# Patient Record
Sex: Male | Born: 1955 | Race: White | Hispanic: No | Marital: Married | Smoking: Former smoker
Health system: Southern US, Community
[De-identification: ages and names within clinical notes are randomized; demographics above are authoritative.]

## PROBLEM LIST (undated history)

## (undated) DIAGNOSIS — I10 Essential (primary) hypertension: Secondary | ICD-10-CM

## (undated) DIAGNOSIS — I2510 Atherosclerotic heart disease of native coronary artery without angina pectoris: Secondary | ICD-10-CM

## (undated) DIAGNOSIS — F329 Major depressive disorder, single episode, unspecified: Secondary | ICD-10-CM

## (undated) DIAGNOSIS — K635 Polyp of colon: Secondary | ICD-10-CM

## (undated) DIAGNOSIS — F32A Depression, unspecified: Secondary | ICD-10-CM

## (undated) DIAGNOSIS — N200 Calculus of kidney: Secondary | ICD-10-CM

## (undated) DIAGNOSIS — R42 Dizziness and giddiness: Secondary | ICD-10-CM

## (undated) DIAGNOSIS — R7303 Prediabetes: Secondary | ICD-10-CM

## (undated) DIAGNOSIS — M1990 Unspecified osteoarthritis, unspecified site: Secondary | ICD-10-CM

## (undated) DIAGNOSIS — N189 Chronic kidney disease, unspecified: Secondary | ICD-10-CM

## (undated) DIAGNOSIS — G4730 Sleep apnea, unspecified: Secondary | ICD-10-CM

## (undated) DIAGNOSIS — I519 Heart disease, unspecified: Secondary | ICD-10-CM

## (undated) DIAGNOSIS — R0600 Dyspnea, unspecified: Secondary | ICD-10-CM

## (undated) DIAGNOSIS — E7800 Pure hypercholesterolemia, unspecified: Secondary | ICD-10-CM

## (undated) DIAGNOSIS — Z87442 Personal history of urinary calculi: Secondary | ICD-10-CM

## (undated) HISTORY — DX: Polyp of colon: K63.5

## (undated) HISTORY — PX: CARDIAC CATHETERIZATION: SHX172

## (undated) HISTORY — DX: Heart disease, unspecified: I51.9

## (undated) HISTORY — PX: OTHER SURGICAL HISTORY: SHX169

## (undated) HISTORY — DX: Calculus of kidney: N20.0

## (undated) HISTORY — DX: Pure hypercholesterolemia, unspecified: E78.00

---

## 2010-11-04 ENCOUNTER — Ambulatory Visit (HOSPITAL_COMMUNITY)
Admission: RE | Admit: 2010-11-04 | Discharge: 2010-11-04 | Disposition: A | Discharge status: Home / Self Care | Payer: Medicaid Other | Source: Ambulatory Visit | Attending: Physical Therapy | Admitting: Physical Therapy

## 2010-11-04 DIAGNOSIS — M6281 Muscle weakness (generalized): Secondary | ICD-10-CM | POA: Insufficient documentation

## 2010-11-04 DIAGNOSIS — IMO0001 Reserved for inherently not codable concepts without codable children: Secondary | ICD-10-CM | POA: Insufficient documentation

## 2010-11-04 DIAGNOSIS — M545 Low back pain, unspecified: Secondary | ICD-10-CM | POA: Insufficient documentation

## 2010-11-04 DIAGNOSIS — G8929 Other chronic pain: Secondary | ICD-10-CM | POA: Insufficient documentation

## 2010-11-04 DIAGNOSIS — M79609 Pain in unspecified limb: Secondary | ICD-10-CM | POA: Insufficient documentation
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
## 2010-11-11 ENCOUNTER — Ambulatory Visit (HOSPITAL_COMMUNITY): Payer: Medicaid Other | Admitting: Physical Therapy

## 2010-11-11 ENCOUNTER — Telehealth (HOSPITAL_COMMUNITY): Payer: Self-pay

## 2010-11-18 ENCOUNTER — Ambulatory Visit (HOSPITAL_COMMUNITY): Payer: Medicaid Other | Admitting: Physical Therapy

## 2013-10-04 HISTORY — PX: CORONARY STENT PLACEMENT: SHX1402

## 2013-11-20 ENCOUNTER — Encounter (HOSPITAL_COMMUNITY)
Admission: RE | Admit: 2013-11-20 | Discharge: 2013-11-20 | Disposition: A | Discharge status: Home / Self Care | Payer: Medicare HMO | Source: Ambulatory Visit | Attending: Cardiology | Admitting: Cardiology

## 2013-11-20 ENCOUNTER — Encounter (HOSPITAL_COMMUNITY): Payer: Self-pay

## 2013-11-20 VITALS — BP 130/68 | HR 61 | Ht 72.0 in | Wt 239.2 lb

## 2013-11-20 DIAGNOSIS — Z9861 Coronary angioplasty status: Secondary | ICD-10-CM

## 2013-11-20 DIAGNOSIS — Z955 Presence of coronary angioplasty implant and graft: Secondary | ICD-10-CM | POA: Insufficient documentation

## 2013-11-20 DIAGNOSIS — I2510 Atherosclerotic heart disease of native coronary artery without angina pectoris: Secondary | ICD-10-CM | POA: Insufficient documentation

## 2013-11-20 DIAGNOSIS — Z5189 Encounter for other specified aftercare: Secondary | ICD-10-CM | POA: Insufficient documentation

## 2013-11-20 HISTORY — DX: Atherosclerotic heart disease of native coronary artery without angina pectoris: I25.10

## 2013-11-20 HISTORY — DX: Chronic kidney disease, unspecified: N18.9

## 2013-11-20 NOTE — Progress Notes (Signed)
Patient was referred to CR by Dr. Gennette Pac due to Stent placement V45.82. During orientation advised patient on arrival and appointment times what to wear, what to do before, during and after exercise. Reviewed attendance and class policy. Talked about inclement weather and class consultation policy. Pt is scheduled to start Cardiac Rehab on 11/26/13 at 9:30am. Pt was advised to come to class 5 minutes before class starts. He was also given instructions on meeting with the dietician and attending the Family Structure classes. Pt is eager to get started. Patient was able to complete 6 minute walk test in 6 minutes.

## 2013-11-20 NOTE — Patient Instructions (Signed)
Pt has finished orientation and is scheduled to start CR on 11/26/13 at 9:30 am. Pt has been instructed to arrive to class 15 minutes early for scheduled class. Pt has been instructed to wear comfortable clothing and shoes with rubber soles. Pt has been told to take their medications 1 hour prior to coming to class.  If the patient is not going to attend class, he/she has been instructed to call.

## 2013-11-26 ENCOUNTER — Encounter (HOSPITAL_COMMUNITY)
Admission: RE | Admit: 2013-11-26 | Discharge: 2013-11-26 | Disposition: A | Discharge status: Home / Self Care | Payer: Medicare HMO | Source: Ambulatory Visit | Attending: Cardiology | Admitting: Cardiology

## 2013-11-26 DIAGNOSIS — Z955 Presence of coronary angioplasty implant and graft: Secondary | ICD-10-CM | POA: Diagnosis not present

## 2013-11-26 DIAGNOSIS — Z5189 Encounter for other specified aftercare: Secondary | ICD-10-CM | POA: Diagnosis present

## 2013-11-26 DIAGNOSIS — I2510 Atherosclerotic heart disease of native coronary artery without angina pectoris: Secondary | ICD-10-CM | POA: Diagnosis not present

## 2013-11-28 ENCOUNTER — Encounter (HOSPITAL_COMMUNITY)
Admission: RE | Admit: 2013-11-28 | Discharge: 2013-11-28 | Disposition: A | Discharge status: Home / Self Care | Payer: Medicare HMO | Source: Ambulatory Visit | Attending: Cardiology | Admitting: Cardiology

## 2013-11-28 DIAGNOSIS — Z5189 Encounter for other specified aftercare: Secondary | ICD-10-CM | POA: Diagnosis not present

## 2013-11-30 ENCOUNTER — Encounter (HOSPITAL_COMMUNITY): Payer: Medicare HMO

## 2013-12-03 ENCOUNTER — Encounter (HOSPITAL_COMMUNITY)
Admission: RE | Admit: 2013-12-03 | Discharge: 2013-12-03 | Disposition: A | Discharge status: Home / Self Care | Payer: Medicare HMO | Source: Ambulatory Visit | Attending: Cardiology | Admitting: Cardiology

## 2013-12-03 DIAGNOSIS — Z5189 Encounter for other specified aftercare: Secondary | ICD-10-CM | POA: Diagnosis not present

## 2013-12-05 ENCOUNTER — Encounter (HOSPITAL_COMMUNITY)
Admission: RE | Admit: 2013-12-05 | Discharge: 2013-12-05 | Disposition: A | Discharge status: Home / Self Care | Payer: Medicare HMO | Source: Ambulatory Visit | Attending: Cardiology | Admitting: Cardiology

## 2013-12-05 DIAGNOSIS — Z5189 Encounter for other specified aftercare: Secondary | ICD-10-CM | POA: Diagnosis not present

## 2013-12-05 NOTE — Progress Notes (Signed)
Cardiac Rehabilitation Program Outcomes Report   Orientation:  11/26/13 Graduate Date:  tbd Discharge Date:  tbd # of sessions completed: 3  Cardiologist: Gennette Pac Family MD:  Baucom Class Time:  0930  A.  Exercise Program:  Tolerates exercise @ 3.71 METS for 15 minutes and Walk Test Results:  Pre: 2.45 mets  B.  Mental Health:  Good mental attitude  C.  Education/Instruction/Skills  Accurately checks own pulse.  Rest:  77  Exercise:  92 and Knows THR for exercise  Uses Perceived Exertion Scale and/or Dyspnea Scale  D.  Nutrition/Weight Control/Body Composition:  Adherence to prescribed nutrition program: good    E.  Blood Lipids    No results found for this basename: CHOL, HDL, LDLCALC, LDLDIRECT, TRIG, CHOLHDL    F.  Lifestyle Changes:  Making positive lifestyle changes  G.  Symptoms noted with exercise:  Asymptomatic  Report Completed By:  Benay Pillow RN   Comments:  First week progress note.

## 2013-12-07 ENCOUNTER — Encounter (HOSPITAL_COMMUNITY): Payer: Medicare HMO

## 2013-12-10 ENCOUNTER — Encounter (HOSPITAL_COMMUNITY)
Admission: RE | Admit: 2013-12-10 | Discharge: 2013-12-10 | Disposition: A | Discharge status: Home / Self Care | Payer: Medicare HMO | Source: Ambulatory Visit | Attending: Cardiology | Admitting: Cardiology

## 2013-12-10 DIAGNOSIS — Z5189 Encounter for other specified aftercare: Secondary | ICD-10-CM | POA: Diagnosis present

## 2013-12-10 DIAGNOSIS — I2510 Atherosclerotic heart disease of native coronary artery without angina pectoris: Secondary | ICD-10-CM | POA: Insufficient documentation

## 2013-12-10 DIAGNOSIS — Z955 Presence of coronary angioplasty implant and graft: Secondary | ICD-10-CM | POA: Diagnosis not present

## 2013-12-12 ENCOUNTER — Encounter (HOSPITAL_COMMUNITY): Payer: Medicare HMO

## 2013-12-12 DIAGNOSIS — Z5189 Encounter for other specified aftercare: Secondary | ICD-10-CM | POA: Diagnosis not present

## 2013-12-14 ENCOUNTER — Encounter (HOSPITAL_COMMUNITY): Payer: Medicare HMO

## 2013-12-17 ENCOUNTER — Encounter (HOSPITAL_COMMUNITY)
Admission: RE | Admit: 2013-12-17 | Discharge: 2013-12-17 | Disposition: A | Discharge status: Home / Self Care | Payer: Medicare HMO | Source: Ambulatory Visit | Attending: Cardiology | Admitting: Cardiology

## 2013-12-17 DIAGNOSIS — Z5189 Encounter for other specified aftercare: Secondary | ICD-10-CM | POA: Diagnosis not present

## 2013-12-18 ENCOUNTER — Encounter (HOSPITAL_COMMUNITY): Payer: Medicare HMO

## 2013-12-19 ENCOUNTER — Encounter (HOSPITAL_COMMUNITY)
Admission: RE | Admit: 2013-12-19 | Discharge: 2013-12-19 | Disposition: A | Discharge status: Home / Self Care | Payer: Medicare HMO | Source: Ambulatory Visit | Attending: Cardiology | Admitting: Cardiology

## 2013-12-19 DIAGNOSIS — Z5189 Encounter for other specified aftercare: Secondary | ICD-10-CM | POA: Diagnosis not present

## 2013-12-21 ENCOUNTER — Encounter (HOSPITAL_COMMUNITY): Payer: Medicare HMO

## 2013-12-24 ENCOUNTER — Encounter (HOSPITAL_COMMUNITY)
Admission: RE | Admit: 2013-12-24 | Discharge: 2013-12-24 | Disposition: A | Discharge status: Home / Self Care | Payer: Medicare HMO | Source: Ambulatory Visit | Attending: Cardiology | Admitting: Cardiology

## 2013-12-24 DIAGNOSIS — Z5189 Encounter for other specified aftercare: Secondary | ICD-10-CM | POA: Diagnosis not present

## 2013-12-26 ENCOUNTER — Encounter (HOSPITAL_COMMUNITY)
Admission: RE | Admit: 2013-12-26 | Discharge: 2013-12-26 | Disposition: A | Discharge status: Home / Self Care | Payer: Medicare HMO | Source: Ambulatory Visit | Attending: Cardiology | Admitting: Cardiology

## 2013-12-26 DIAGNOSIS — Z5189 Encounter for other specified aftercare: Secondary | ICD-10-CM | POA: Diagnosis not present

## 2013-12-28 ENCOUNTER — Encounter (HOSPITAL_COMMUNITY): Payer: Medicare HMO

## 2013-12-31 ENCOUNTER — Encounter (HOSPITAL_COMMUNITY)
Admission: RE | Admit: 2013-12-31 | Discharge: 2013-12-31 | Disposition: A | Discharge status: Home / Self Care | Payer: Medicare HMO | Source: Ambulatory Visit | Attending: Cardiology | Admitting: Cardiology

## 2013-12-31 DIAGNOSIS — Z5189 Encounter for other specified aftercare: Secondary | ICD-10-CM | POA: Diagnosis not present

## 2014-01-02 ENCOUNTER — Encounter (HOSPITAL_COMMUNITY): Payer: Medicare HMO

## 2014-01-04 ENCOUNTER — Encounter (HOSPITAL_COMMUNITY): Payer: Medicare HMO

## 2014-01-07 ENCOUNTER — Encounter (HOSPITAL_COMMUNITY)
Admission: RE | Admit: 2014-01-07 | Discharge: 2014-01-07 | Disposition: A | Discharge status: Home / Self Care | Payer: Medicare HMO | Source: Ambulatory Visit | Attending: Cardiology | Admitting: Cardiology

## 2014-01-07 DIAGNOSIS — Z5189 Encounter for other specified aftercare: Secondary | ICD-10-CM | POA: Diagnosis not present

## 2014-01-09 ENCOUNTER — Encounter (HOSPITAL_COMMUNITY)
Admission: RE | Admit: 2014-01-09 | Discharge: 2014-01-09 | Disposition: A | Discharge status: Home / Self Care | Payer: Medicare HMO | Source: Ambulatory Visit | Attending: Cardiology | Admitting: Cardiology

## 2014-01-09 DIAGNOSIS — Z955 Presence of coronary angioplasty implant and graft: Secondary | ICD-10-CM | POA: Diagnosis not present

## 2014-01-09 DIAGNOSIS — I2510 Atherosclerotic heart disease of native coronary artery without angina pectoris: Secondary | ICD-10-CM | POA: Insufficient documentation

## 2014-01-09 DIAGNOSIS — Z5189 Encounter for other specified aftercare: Secondary | ICD-10-CM | POA: Insufficient documentation

## 2014-01-11 ENCOUNTER — Encounter (HOSPITAL_COMMUNITY): Payer: Medicare HMO

## 2014-01-14 ENCOUNTER — Encounter (HOSPITAL_COMMUNITY)
Admission: RE | Admit: 2014-01-14 | Discharge: 2014-01-14 | Disposition: A | Discharge status: Home / Self Care | Payer: Medicare HMO | Source: Ambulatory Visit | Attending: Cardiology | Admitting: Cardiology

## 2014-01-14 DIAGNOSIS — Z5189 Encounter for other specified aftercare: Secondary | ICD-10-CM | POA: Diagnosis not present

## 2014-01-16 ENCOUNTER — Encounter (HOSPITAL_COMMUNITY)
Admission: RE | Admit: 2014-01-16 | Discharge: 2014-01-16 | Disposition: A | Discharge status: Home / Self Care | Payer: Medicare HMO | Source: Ambulatory Visit | Attending: Cardiology | Admitting: Cardiology

## 2014-01-16 DIAGNOSIS — Z5189 Encounter for other specified aftercare: Secondary | ICD-10-CM | POA: Diagnosis not present

## 2014-01-18 ENCOUNTER — Encounter (HOSPITAL_COMMUNITY): Payer: Medicare HMO

## 2014-01-21 ENCOUNTER — Encounter (HOSPITAL_COMMUNITY)
Admission: RE | Admit: 2014-01-21 | Discharge: 2014-01-21 | Disposition: A | Discharge status: Home / Self Care | Payer: Medicare HMO | Source: Ambulatory Visit | Attending: Cardiology | Admitting: Cardiology

## 2014-01-21 DIAGNOSIS — Z5189 Encounter for other specified aftercare: Secondary | ICD-10-CM | POA: Diagnosis not present

## 2014-01-23 ENCOUNTER — Encounter (HOSPITAL_COMMUNITY)
Admission: RE | Admit: 2014-01-23 | Discharge: 2014-01-23 | Disposition: A | Discharge status: Home / Self Care | Payer: Medicare HMO | Source: Ambulatory Visit | Attending: Cardiology | Admitting: Cardiology

## 2014-01-23 DIAGNOSIS — Z5189 Encounter for other specified aftercare: Secondary | ICD-10-CM | POA: Diagnosis not present

## 2014-01-25 ENCOUNTER — Encounter (HOSPITAL_COMMUNITY): Payer: Medicare HMO

## 2014-01-28 ENCOUNTER — Encounter (HOSPITAL_COMMUNITY)
Admission: RE | Admit: 2014-01-28 | Discharge: 2014-01-28 | Disposition: A | Discharge status: Home / Self Care | Payer: Medicare HMO | Source: Ambulatory Visit | Attending: Cardiology | Admitting: Cardiology

## 2014-01-28 DIAGNOSIS — Z5189 Encounter for other specified aftercare: Secondary | ICD-10-CM | POA: Diagnosis not present

## 2014-01-29 NOTE — Progress Notes (Signed)
Cardiac Rehabilitation Program Outcomes Report   Orientation:  11/26/13 Graduate Date:  tbd Discharge Date:  tbd # of sessions completed: 18  Cardiologist: Gennette Pac Family MD:  Baucom Class Time:  0930  A.  Exercise Program:  Tolerates exercise @ 3.71 METS for 15 minutes  B.  Mental Health:  Good mental attitude  C.  Education/Instruction/Skills  Accurately checks own pulse.  Rest:  76  Exercise:  96 and Knows THR for exercise  Uses Perceived Exertion Scale and/or Dyspnea Scale  D.  Nutrition/Weight Control/Body Composition:  Adherence to prescribed nutrition program: good    E.  Blood Lipids   No results found for: CHOL, HDL, LDLCALC, LDLDIRECT, TRIG, CHOLHDL  F.  Lifestyle Changes:  Making positive lifestyle changes  G.  Symptoms noted with exercise:  Asymptomatic  Report Completed By:  Stevphen Rochester RN   Comments:  This is patients halfway note. Pt is progressing well. Patient initially was depressed due to sick mother at home with Hospice care. Positive changes were visible within second week due to the social impact of the program.

## 2014-01-30 ENCOUNTER — Encounter (HOSPITAL_COMMUNITY)
Admission: RE | Admit: 2014-01-30 | Discharge: 2014-01-30 | Disposition: A | Discharge status: Home / Self Care | Payer: Medicare HMO | Source: Ambulatory Visit | Attending: Cardiology | Admitting: Cardiology

## 2014-01-30 DIAGNOSIS — Z5189 Encounter for other specified aftercare: Secondary | ICD-10-CM | POA: Diagnosis not present

## 2014-02-01 ENCOUNTER — Encounter (HOSPITAL_COMMUNITY): Payer: Medicare HMO

## 2014-02-04 ENCOUNTER — Encounter (HOSPITAL_COMMUNITY)
Admission: RE | Admit: 2014-02-04 | Discharge: 2014-02-04 | Disposition: A | Discharge status: Home / Self Care | Payer: Medicare HMO | Source: Ambulatory Visit | Attending: Cardiology | Admitting: Cardiology

## 2014-02-04 DIAGNOSIS — Z5189 Encounter for other specified aftercare: Secondary | ICD-10-CM | POA: Diagnosis not present

## 2014-02-06 ENCOUNTER — Encounter (HOSPITAL_COMMUNITY)
Admission: RE | Admit: 2014-02-06 | Discharge: 2014-02-06 | Disposition: A | Discharge status: Home / Self Care | Payer: Medicare HMO | Source: Ambulatory Visit | Attending: Cardiology | Admitting: Cardiology

## 2014-02-06 DIAGNOSIS — Z5189 Encounter for other specified aftercare: Secondary | ICD-10-CM | POA: Diagnosis not present

## 2014-02-08 ENCOUNTER — Encounter (HOSPITAL_COMMUNITY): Payer: Medicare HMO

## 2014-02-11 ENCOUNTER — Encounter (HOSPITAL_COMMUNITY)
Admission: RE | Admit: 2014-02-11 | Discharge: 2014-02-11 | Disposition: A | Discharge status: Home / Self Care | Payer: Medicare HMO | Source: Ambulatory Visit | Attending: Cardiology | Admitting: Cardiology

## 2014-02-11 DIAGNOSIS — Z5189 Encounter for other specified aftercare: Secondary | ICD-10-CM | POA: Insufficient documentation

## 2014-02-11 DIAGNOSIS — Z955 Presence of coronary angioplasty implant and graft: Secondary | ICD-10-CM | POA: Diagnosis not present

## 2014-02-11 DIAGNOSIS — I2510 Atherosclerotic heart disease of native coronary artery without angina pectoris: Secondary | ICD-10-CM | POA: Diagnosis not present

## 2014-02-13 ENCOUNTER — Encounter (HOSPITAL_COMMUNITY): Payer: Medicare HMO

## 2014-02-15 ENCOUNTER — Encounter (HOSPITAL_COMMUNITY): Payer: Medicare HMO

## 2014-02-18 ENCOUNTER — Encounter (HOSPITAL_COMMUNITY)
Admission: RE | Admit: 2014-02-18 | Discharge: 2014-02-18 | Disposition: A | Discharge status: Home / Self Care | Payer: Medicare HMO | Source: Ambulatory Visit | Attending: Cardiology | Admitting: Cardiology

## 2014-02-18 DIAGNOSIS — Z5189 Encounter for other specified aftercare: Secondary | ICD-10-CM | POA: Diagnosis not present

## 2014-02-20 ENCOUNTER — Encounter (HOSPITAL_COMMUNITY): Payer: Medicare HMO

## 2014-02-22 ENCOUNTER — Encounter (HOSPITAL_COMMUNITY)
Admission: RE | Admit: 2014-02-22 | Discharge: 2014-02-22 | Disposition: A | Discharge status: Home / Self Care | Payer: Medicare HMO | Source: Ambulatory Visit | Attending: Cardiology | Admitting: Cardiology

## 2014-02-22 DIAGNOSIS — Z5189 Encounter for other specified aftercare: Secondary | ICD-10-CM | POA: Diagnosis not present

## 2014-02-25 ENCOUNTER — Encounter (HOSPITAL_COMMUNITY): Payer: Medicare HMO

## 2014-02-27 ENCOUNTER — Encounter (HOSPITAL_COMMUNITY): Payer: Medicare HMO

## 2014-03-01 ENCOUNTER — Encounter (HOSPITAL_COMMUNITY): Payer: Medicare HMO

## 2014-03-04 ENCOUNTER — Encounter (HOSPITAL_COMMUNITY): Payer: Medicare HMO

## 2014-03-06 ENCOUNTER — Encounter (HOSPITAL_COMMUNITY): Payer: Medicare HMO

## 2014-03-08 ENCOUNTER — Encounter (HOSPITAL_COMMUNITY): Payer: Medicare HMO

## 2014-03-11 ENCOUNTER — Encounter (HOSPITAL_COMMUNITY): Payer: Medicare HMO

## 2014-03-13 ENCOUNTER — Encounter (HOSPITAL_COMMUNITY): Payer: Medicare HMO

## 2014-03-15 ENCOUNTER — Encounter (HOSPITAL_COMMUNITY): Payer: Medicare HMO

## 2014-03-18 ENCOUNTER — Encounter (HOSPITAL_COMMUNITY): Payer: Medicare HMO

## 2014-03-20 ENCOUNTER — Encounter (HOSPITAL_COMMUNITY): Payer: Medicare HMO

## 2014-03-22 ENCOUNTER — Encounter (HOSPITAL_COMMUNITY): Payer: Medicare HMO

## 2014-04-02 NOTE — Progress Notes (Signed)
Patient is discharged from [place] and Pulmonary program today, February 22, 2014 with 24 sessions.  Patient had to stop due to health. He achieved LTG of 30 minutes of aerobic exercise at max met level of 3.2. Cardiac Rehab will make 1 month, 6 month and 1 year call backs.  Marland Kitchen

## 2014-04-02 NOTE — Addendum Note (Signed)
Encounter addended by: Cathie Olden, RN on: 04/02/2014  1:08 PM<BR>     Documentation filed: Notes Section

## 2015-03-06 ENCOUNTER — Encounter (INDEPENDENT_AMBULATORY_CARE_PROVIDER_SITE_OTHER): Payer: Self-pay | Admitting: *Deleted

## 2015-04-10 ENCOUNTER — Other Ambulatory Visit (INDEPENDENT_AMBULATORY_CARE_PROVIDER_SITE_OTHER): Payer: Self-pay | Admitting: Internal Medicine

## 2015-04-10 DIAGNOSIS — Z1211 Encounter for screening for malignant neoplasm of colon: Secondary | ICD-10-CM

## 2015-04-11 ENCOUNTER — Encounter (INDEPENDENT_AMBULATORY_CARE_PROVIDER_SITE_OTHER): Payer: Self-pay | Admitting: *Deleted

## 2015-04-11 ENCOUNTER — Other Ambulatory Visit (INDEPENDENT_AMBULATORY_CARE_PROVIDER_SITE_OTHER): Payer: Self-pay | Admitting: *Deleted

## 2015-04-11 ENCOUNTER — Telehealth (INDEPENDENT_AMBULATORY_CARE_PROVIDER_SITE_OTHER): Payer: Self-pay | Admitting: *Deleted

## 2015-04-11 MED ORDER — PEG 3350-KCL-NA BICARB-NACL 420 G PO SOLR: 4000.0000 mL | Freq: Once | ORAL | Status: DC

## 2015-04-11 NOTE — Telephone Encounter (Signed)
Referring MD/PCP: [facility]   Procedure: tcs with propofol  Reason/Indication:  screening  Has patient had this procedure before?  no  If so, when, by whom and where?    Is there a family history of colon cancer?  no  Who?  What age when diagnosed?    Is patient diabetic?   no      Does patient have prosthetic heart valve or mechanical valve?  no  Do you have a pacemaker?  no  Has patient ever had endocarditis? no  Has patient had joint replacement within last 12 months?  no  Does patient tend to be constipated or take laxatives? no  Does patient have a history of alcohol/drug use?  no  Is patient on Coumadin, Plavix and/or Aspirin? yes  Medications: hctz 25 mg daily, asa 81 mg daily, effient 10 mg daily, klonopin 1 mg 1/2 tab bid, metoprolol 25 mg bid, crestor 40 mg daily, gabapentin 600 mg bid, celexa 40 mg daily, nortriptyline 50 mg daily   Allergies: nkda  Medication Adjustment: asa & effient 2 days before  Procedure date & time: 05/09/15 at 1030

## 2015-04-11 NOTE — Telephone Encounter (Signed)
agree

## 2015-04-11 NOTE — Telephone Encounter (Signed)
Patient needs trilyte 

## 2015-05-01 ENCOUNTER — Telehealth (INDEPENDENT_AMBULATORY_CARE_PROVIDER_SITE_OTHER): Payer: Self-pay | Admitting: *Deleted

## 2015-05-01 NOTE — Telephone Encounter (Signed)
TCS sch'd 05/09/15, per Dr Gennette Pac, patient needs to continue ASA but can stop Effient, patient aware

## 2015-05-05 NOTE — Patient Instructions (Signed)
POLLUX EYE  05/05/2015     @PREFPERIOPPHARMACY @   Your procedure is scheduled on 05/09/2015.  Report to [facility] at 8:00 A.M.  Call this number if you have problems the morning of surgery:  [phone]   Remember:  Do not eat food or drink liquids after midnight.  Take these medicines the morning of surgery with A SIP OF WATER celexa, Klonopin, Gabapentin, Lisinopril, Metoprolol, Pamelor   Do not wear jewelry, make-up or nail polish.  Do not wear lotions, powders, or perfumes.  You may wear deodorant.  Do not shave 48 hours prior to surgery.  Men may shave face and neck.  Do not bring valuables to the hospital.  [facility] is not responsible for any belongings or valuables.  Contacts, dentures or bridgework may not be worn into surgery.  Leave your suitcase in the car.  After surgery it may be brought to your room.  For patients admitted to the hospital, discharge time will be determined by your treatment team.  Patients discharged the day of surgery will not be allowed to drive home.    Please read over the following fact sheets that you were given. Anesthesia Post-op Instructions     PATIENT INSTRUCTIONS POST-ANESTHESIA  IMMEDIATELY FOLLOWING SURGERY:  Do not drive or operate machinery for the first twenty four hours after surgery.  Do not make any important decisions for twenty four hours after surgery or while taking narcotic pain medications or sedatives.  If you develop intractable nausea and vomiting or a severe headache please notify your doctor immediately.  FOLLOW-UP:  Please make an appointment with your surgeon as instructed. You do not need to follow up with anesthesia unless specifically instructed to do so.  WOUND CARE INSTRUCTIONS (if applicable):  Keep a dry clean dressing on the anesthesia/puncture wound site if there is drainage.  Once the wound has quit draining you may leave it open to air.  Generally you should leave the bandage intact for  twenty four hours unless there is drainage.  If the epidural site drains for more than 36-48 hours please call the anesthesia department.  QUESTIONS?:  Please feel free to call your physician or the hospital operator if you have any questions, and they will be happy to assist you.      Colonoscopy A colonoscopy is an exam to look at the entire large intestine (colon). This exam can help find problems such as tumors, polyps, inflammation, and areas of bleeding. The exam takes about 1 hour.  LET [facility] CARE PROVIDER KNOW ABOUT:   Any allergies you have.  All medicines you are taking, including vitamins, herbs, eye drops, creams, and over-the-counter medicines.  Previous problems you or members of your family have had with the use of anesthetics.  Any blood disorders you have.  Previous surgeries you have had.  Medical conditions you have. RISKS AND COMPLICATIONS  Generally, this is a safe procedure. However, as with any procedure, complications can occur. Possible complications include:  Bleeding.  Tearing or rupture of the colon wall.  Reaction to medicines given during the exam.  Infection (rare). BEFORE THE PROCEDURE   Ask your health care provider about changing or stopping your regular medicines.  You may be prescribed an oral bowel prep. This involves drinking a large amount of medicated liquid, starting the day before your procedure. The liquid will cause you to have multiple loose stools until your stool is almost clear or light green. This cleans out  your colon in preparation for the procedure.  Do not eat or drink anything else once you have started the bowel prep, unless your health care provider tells you it is safe to do so.  Arrange for someone to drive you home after the procedure. PROCEDURE   You will be given medicine to help you relax (sedative).  You will lie on your side with your knees bent.  A long, flexible tube with a light and camera on the end  (colonoscope) will be inserted through the rectum and into the colon. The camera sends video back to a computer screen as it moves through the colon. The colonoscope also releases carbon dioxide gas to inflate the colon. This helps your health care provider see the area better.  During the exam, your health care provider may take a small tissue sample (biopsy) to be examined under a microscope if any abnormalities are found.  The exam is finished when the entire colon has been viewed. AFTER THE PROCEDURE   Do not drive for 24 hours after the exam.  You may have a small amount of blood in your stool.  You may pass moderate amounts of gas and have mild abdominal cramping or bloating. This is caused by the gas used to inflate your colon during the exam.  Ask when your test results will be ready and how you will get your results. Make sure you get your test results.   This information is not intended to replace advice given to you by your health care provider. Make sure you discuss any questions you have with your health care provider.   Document Released: 01/23/2000 Document Revised: 11/15/2012 Document Reviewed: 10/02/2012 Elsevier Interactive Patient Education [employer].

## 2015-05-06 ENCOUNTER — Other Ambulatory Visit: Payer: Self-pay

## 2015-05-06 ENCOUNTER — Encounter (HOSPITAL_COMMUNITY): Payer: Self-pay

## 2015-05-06 ENCOUNTER — Encounter (HOSPITAL_COMMUNITY)
Admission: RE | Admit: 2015-05-06 | Discharge: 2015-05-06 | Disposition: A | Discharge status: Home / Self Care | Payer: Medicare HMO | Source: Ambulatory Visit | Attending: Internal Medicine | Admitting: Internal Medicine

## 2015-05-06 VITALS — BP 126/55 | Temp 98.3°F | Resp 20 | Ht <= 58 in | Wt 256.0 lb

## 2015-05-06 DIAGNOSIS — I129 Hypertensive chronic kidney disease with stage 1 through stage 4 chronic kidney disease, or unspecified chronic kidney disease: Secondary | ICD-10-CM | POA: Diagnosis not present

## 2015-05-06 DIAGNOSIS — Z7982 Long term (current) use of aspirin: Secondary | ICD-10-CM | POA: Diagnosis not present

## 2015-05-06 DIAGNOSIS — Z1211 Encounter for screening for malignant neoplasm of colon: Secondary | ICD-10-CM

## 2015-05-06 DIAGNOSIS — K5730 Diverticulosis of large intestine without perforation or abscess without bleeding: Secondary | ICD-10-CM | POA: Diagnosis not present

## 2015-05-06 DIAGNOSIS — Z79899 Other long term (current) drug therapy: Secondary | ICD-10-CM | POA: Diagnosis not present

## 2015-05-06 DIAGNOSIS — M1991 Primary osteoarthritis, unspecified site: Secondary | ICD-10-CM | POA: Diagnosis not present

## 2015-05-06 DIAGNOSIS — D120 Benign neoplasm of cecum: Secondary | ICD-10-CM | POA: Diagnosis not present

## 2015-05-06 DIAGNOSIS — F329 Major depressive disorder, single episode, unspecified: Secondary | ICD-10-CM | POA: Diagnosis not present

## 2015-05-06 DIAGNOSIS — N189 Chronic kidney disease, unspecified: Secondary | ICD-10-CM | POA: Diagnosis not present

## 2015-05-06 DIAGNOSIS — D123 Benign neoplasm of transverse colon: Secondary | ICD-10-CM | POA: Diagnosis not present

## 2015-05-06 HISTORY — DX: Major depressive disorder, single episode, unspecified: F32.9

## 2015-05-06 HISTORY — DX: Essential (primary) hypertension: I10

## 2015-05-06 HISTORY — DX: Dizziness and giddiness: R42

## 2015-05-06 HISTORY — DX: Unspecified osteoarthritis, unspecified site: M19.90

## 2015-05-06 HISTORY — DX: Depression, unspecified: F32.A

## 2015-05-06 LAB — BASIC METABOLIC PANEL
Anion gap: 8 (ref 5–15)
BUN: 18 mg/dL (ref 6–20)
CO2: 27 mmol/L (ref 22–32)
Calcium: 8.7 mg/dL — ABNORMAL LOW (ref 8.9–10.3)
Chloride: 101 mmol/L (ref 101–111)
Creatinine, Ser: 1.27 mg/dL — ABNORMAL HIGH (ref 0.61–1.24)
GFR calc Af Amer: >60 mL/min (ref >60)
GFR calc non Af Amer: 60 mL/min — ABNORMAL LOW (ref >60)
Glucose, Bld: 106 mg/dL — ABNORMAL HIGH (ref 65–99)
Potassium: 3.1 mmol/L — ABNORMAL LOW (ref 3.5–5.1)
Sodium: 136 mmol/L (ref 135–145)

## 2015-05-06 LAB — CBC
HCT: 43.1 % (ref 39.0–52.0)
Hemoglobin: 14.5 g/dL (ref 13.0–17.0)
MCH: 29.7 pg (ref 26.0–34.0)
MCHC: 33.6 g/dL (ref 30.0–36.0)
MCV: 88.3 fL (ref 78.0–100.0)
Platelets: 139 10*3/uL — ABNORMAL LOW (ref 150–400)
RBC: 4.88 MIL/uL (ref 4.22–5.81)
RDW: 13.7 % (ref 11.5–15.5)
WBC: 4.7 10*3/uL (ref 4.0–10.5)

## 2015-05-06 NOTE — Pre-Procedure Instructions (Signed)
My Chart info given. To be setup at home.

## 2015-05-09 ENCOUNTER — Ambulatory Visit (HOSPITAL_COMMUNITY): Payer: Medicare HMO | Admitting: Anesthesiology

## 2015-05-09 ENCOUNTER — Encounter (HOSPITAL_COMMUNITY): Payer: Self-pay | Admitting: *Deleted

## 2015-05-09 ENCOUNTER — Ambulatory Visit (HOSPITAL_COMMUNITY)
Admission: RE | Admit: 2015-05-09 | Discharge: 2015-05-09 | Disposition: A | Discharge status: Home / Self Care | Payer: Medicare HMO | Source: Ambulatory Visit | Attending: Internal Medicine | Admitting: Internal Medicine

## 2015-05-09 ENCOUNTER — Encounter (HOSPITAL_COMMUNITY)
Admission: RE | Disposition: A | Discharge status: Home / Self Care | Payer: Self-pay | Source: Ambulatory Visit | Attending: Internal Medicine

## 2015-05-09 DIAGNOSIS — Z1211 Encounter for screening for malignant neoplasm of colon: Secondary | ICD-10-CM

## 2015-05-09 DIAGNOSIS — I129 Hypertensive chronic kidney disease with stage 1 through stage 4 chronic kidney disease, or unspecified chronic kidney disease: Secondary | ICD-10-CM | POA: Insufficient documentation

## 2015-05-09 DIAGNOSIS — K621 Rectal polyp: Secondary | ICD-10-CM | POA: Diagnosis not present

## 2015-05-09 DIAGNOSIS — Z79899 Other long term (current) drug therapy: Secondary | ICD-10-CM | POA: Insufficient documentation

## 2015-05-09 DIAGNOSIS — K644 Residual hemorrhoidal skin tags: Secondary | ICD-10-CM

## 2015-05-09 DIAGNOSIS — Z7982 Long term (current) use of aspirin: Secondary | ICD-10-CM | POA: Insufficient documentation

## 2015-05-09 DIAGNOSIS — K5730 Diverticulosis of large intestine without perforation or abscess without bleeding: Secondary | ICD-10-CM | POA: Diagnosis not present

## 2015-05-09 DIAGNOSIS — M1991 Primary osteoarthritis, unspecified site: Secondary | ICD-10-CM | POA: Insufficient documentation

## 2015-05-09 DIAGNOSIS — F329 Major depressive disorder, single episode, unspecified: Secondary | ICD-10-CM | POA: Insufficient documentation

## 2015-05-09 DIAGNOSIS — D120 Benign neoplasm of cecum: Secondary | ICD-10-CM | POA: Insufficient documentation

## 2015-05-09 DIAGNOSIS — D123 Benign neoplasm of transverse colon: Secondary | ICD-10-CM

## 2015-05-09 DIAGNOSIS — N189 Chronic kidney disease, unspecified: Secondary | ICD-10-CM | POA: Insufficient documentation

## 2015-05-09 HISTORY — PX: COLONOSCOPY WITH PROPOFOL: SHX5780

## 2015-05-09 LAB — POCT I-STAT 4, (NA,K, GLUC, HGB,HCT)
Glucose, Bld: 100 mg/dL — ABNORMAL HIGH (ref 65–99)
HCT: 49 % (ref 39.0–52.0)
Hemoglobin: 16.7 g/dL (ref 13.0–17.0)
Potassium: 3.7 mmol/L (ref 3.5–5.1)
Sodium: 138 mmol/L (ref 135–145)

## 2015-05-09 SURGERY — COLONOSCOPY WITH PROPOFOL
Anesthesia: Monitor Anesthesia Care

## 2015-05-09 MED ORDER — MIDAZOLAM HCL 2 MG/2ML IJ SOLN
INTRAMUSCULAR | Status: AC
  Filled 2015-05-09: qty 2

## 2015-05-09 MED ORDER — ONDANSETRON HCL 4 MG/2ML IJ SOLN: 4.0000 mg | Freq: Once | INTRAMUSCULAR | Status: DC | PRN

## 2015-05-09 MED ORDER — PROPOFOL 10 MG/ML IV BOLUS
INTRAVENOUS | Status: AC
  Filled 2015-05-09: qty 20

## 2015-05-09 MED ORDER — MIDAZOLAM HCL 5 MG/5ML IJ SOLN
INTRAMUSCULAR | Status: DC | PRN
Start: 2015-05-09 — End: 2015-05-09
  Administered 2015-05-09: 2 mg via INTRAVENOUS

## 2015-05-09 MED ORDER — MIDAZOLAM HCL 2 MG/2ML IJ SOLN
1.0000 mg | INTRAMUSCULAR | Status: DC | PRN
  Administered 2015-05-09 (×2): 2 mg via INTRAVENOUS
  Filled 2015-05-09 (×2): qty 2

## 2015-05-09 MED ORDER — GLYCOPYRROLATE 0.2 MG/ML IJ SOLN
0.2000 mg | Freq: Once | INTRAMUSCULAR | Status: AC
  Administered 2015-05-09: 0.2 mg via INTRAVENOUS
  Filled 2015-05-09: qty 1

## 2015-05-09 MED ORDER — LIDOCAINE HCL (PF) 1 % IJ SOLN
INTRAMUSCULAR | Status: AC
  Filled 2015-05-09: qty 5

## 2015-05-09 MED ORDER — LACTATED RINGERS IV SOLN
INTRAVENOUS | Status: DC
  Administered 2015-05-09: 1000 mL via INTRAVENOUS

## 2015-05-09 MED ORDER — FENTANYL CITRATE (PF) 100 MCG/2ML IJ SOLN: 25.0000 ug | INTRAMUSCULAR | Status: DC | PRN

## 2015-05-09 MED ORDER — FENTANYL CITRATE (PF) 100 MCG/2ML IJ SOLN
INTRAMUSCULAR | Status: AC
  Filled 2015-05-09: qty 2

## 2015-05-09 MED ORDER — LIDOCAINE HCL (CARDIAC) 10 MG/ML IV SOLN
INTRAVENOUS | Status: DC | PRN
  Administered 2015-05-09: 25 mg via INTRAVENOUS

## 2015-05-09 MED ORDER — FENTANYL CITRATE (PF) 100 MCG/2ML IJ SOLN
25.0000 ug | INTRAMUSCULAR | Status: AC
  Administered 2015-05-09 (×2): 25 ug via INTRAVENOUS

## 2015-05-09 MED ORDER — PROPOFOL 500 MG/50ML IV EMUL
INTRAVENOUS | Status: DC | PRN
  Administered 2015-05-09: 10:00:00 via INTRAVENOUS
  Administered 2015-05-09: 150 ug/kg/min via INTRAVENOUS

## 2015-05-09 NOTE — Anesthesia Preprocedure Evaluation (Signed)
Anesthesia Evaluation  Patient identified by MRN, date of birth, ID band Patient awake    Reviewed: Allergy & Precautions, NPO status , Patient's Chart, lab work & pertinent test results  Airway Mallampati: II  TM Distance: >3 FB     Dental  (+) Edentulous Upper, Dental Advisory Given   Pulmonary former smoker,    breath sounds clear to auscultation       Cardiovascular hypertension, Pt. on home beta blockers and Pt. on medications + CAD and + Cardiac Stents   Rhythm:Regular Rate:Normal     Neuro/Psych PSYCHIATRIC DISORDERS Depression Occasional dizzyness    GI/Hepatic neg GERD  ,  Endo/Other  Morbid obesity  Renal/GU Renal InsufficiencyRenal disease     Musculoskeletal   Abdominal   Peds  Hematology   Anesthesia Other Findings   Reproductive/Obstetrics                             Anesthesia Physical Anesthesia Plan  ASA: III  Anesthesia Plan: MAC   Post-op Pain Management:    Induction: Intravenous  Airway Management Planned: Simple Face Mask  Additional Equipment:   Intra-op Plan:   Post-operative Plan:   Informed Consent: I have reviewed the patients History and Physical, chart, labs and discussed the procedure including the risks, benefits and alternatives for the proposed anesthesia with the patient or authorized representative who has indicated his/her understanding and acceptance.     Plan Discussed with:   Anesthesia Plan Comments:         Anesthesia Quick Evaluation

## 2015-05-09 NOTE — Transfer of Care (Signed)
Immediate Anesthesia Transfer of Care Note  Patient: Gary Guzman  Procedure(s) Performed: Procedure(s) with comments: COLONOSCOPY WITH PROPOFOL (N/A) - 10:30 - Ann to notify pt to arrive at 7:45  Patient Location: PACU  Anesthesia Type:MAC  Level of Consciousness: awake, alert , oriented and patient cooperative  Airway & Oxygen Therapy: Spontaneous breathing; [place]  Post-op Assessment: Report given to RN and Post -op Vital signs reviewed and stable  Post vital signs: Reviewed and stable  Last Vitals:  Filed Vitals:   05/09/15 0945 05/09/15 0950  BP: 113/54 109/69  Pulse:    Temp:    Resp: 20 23    Complications: No apparent anesthesia complications

## 2015-05-09 NOTE — Anesthesia Procedure Notes (Signed)
Procedure Name: MAC Date/Time: 05/09/2015 9:51 AM Performed by: Andree Elk, Gertie Broerman A Pre-anesthesia Checklist: Patient identified, Emergency Drugs available, Suction available, Patient being monitored and Timeout performed Oxygen Delivery Method: Simple face mask

## 2015-05-09 NOTE — H&P (Signed)
Gary Guzman is an 60 y.o. male.   Chief Complaint: Patient is here for colonoscopy. HPI:   patient is 60 year old Caucasian male who is here for screeniig colonoscopy. He denies abdominal pain change in bowel habits or frank rectal bleeding. He has occasional hematochezia felt to be secondary to hemorrhoids.  family history is negative for CRC.  Past Medical History  Diagnosis Date  . Coronary artery disease   . Chronic kidney disease   . Hypertension   . Dizziness   . Depression   . Arthritis     Past Surgical History  Procedure Laterality Date  . Cardiac catheterization    . Coronary stent placement  10/04/13  . Kidney stones      History reviewed. No pertinent family history. Social History:  reports that he has quit smoking. He does not have any smokeless tobacco history on file. He reports that he does not drink alcohol or use illicit drugs.  Allergies: No Known Allergies  Medications Prior to Admission  Medication Sig Dispense Refill  . aspirin 81 MG tablet Take 81 mg by mouth daily.    . citalopram (CELEXA) 40 MG tablet Take 40 mg by mouth daily.    . clonazePAM (KLONOPIN) 1 MG tablet Take 1 mg by mouth 2 (two) times daily.    Marland Kitchen gabapentin (NEURONTIN) 600 MG tablet Take 600 mg by mouth 2 (two) times daily. 600 in am and 1200 at night    . hydrochlorothiazide (HYDRODIURIL) 25 MG tablet Take 25 mg by mouth daily.    Marland Kitchen lisinopril (PRINIVIL,ZESTRIL) 5 MG tablet Take 5 mg by mouth daily.    . metoprolol tartrate (LOPRESSOR) 25 MG tablet Take 25 mg by mouth 2 (two) times daily.    . nortriptyline (PAMELOR) 50 MG capsule Take 50 mg by mouth at bedtime.    . polyethylene glycol-electrolytes (NULYTELY/GOLYTELY) 420 g solution Take 4,000 mLs by mouth once. 4000 mL 0  . prasugrel (EFFIENT) 10 MG TABS tablet Take 10 mg by mouth daily.    . rosuvastatin (CRESTOR) 40 MG tablet Take 40 mg by mouth daily.      Results for orders placed or performed during the hospital encounter of  05/09/15 (from the past 48 hour(s))  I-STAT 4, (NA,K, GLUC, HGB,HCT)     Status: Abnormal   Collection Time: 05/09/15  8:08 AM  Result Value Ref Range   Sodium 138 135 - 145 mmol/L   Potassium 3.7 3.5 - 5.1 mmol/L   Glucose, Bld 100 (H) 65 - 99 mg/dL   HCT 49.0 39.0 - 52.0 %   Hemoglobin 16.7 13.0 - 17.0 g/dL   No results found.  ROS  Blood pressure 123/58, pulse 78, temperature 98.6 F (37 C), temperature source Oral, resp. rate 12, height 6' (1.829 m), weight 256 lb (116.121 kg), SpO2 92 %. Physical Exam  Constitutional: He appears well-developed and well-nourished.  HENT:  Mouth/Throat: Oropharynx is clear and moist.  Eyes: Conjunctivae are normal. No scleral icterus.  Neck: No thyromegaly present.  Cardiovascular: Normal rate, regular rhythm and normal heart sounds.   No murmur heard. Respiratory: Effort normal and breath sounds normal.  GI:  Obese abdomen. It is soft and nontender without organomegaly or masses.  Musculoskeletal: He exhibits no edema.  Lymphadenopathy:    He has no cervical adenopathy.  Neurological: He is alert.  Skin: Skin is warm and dry.     Assessment/Plan  Average risk screening colonoscopy.  Rogene Houston, MD 05/09/2015, 9:39 AM

## 2015-05-09 NOTE — Anesthesia Postprocedure Evaluation (Signed)
Anesthesia Post Note  Patient: Gary Guzman  Procedure(s) Performed: Procedure(s) (LRB): COLONOSCOPY WITH PROPOFOL (N/A)  Patient location during evaluation: PACU Anesthesia Type: MAC Level of consciousness: awake and alert and oriented Pain management: pain level controlled Vital Signs Assessment: post-procedure vital signs reviewed and stable Respiratory status: spontaneous breathing, respiratory function stable and patient connected to nasal cannula oxygen Cardiovascular status: stable Postop Assessment: no signs of nausea or vomiting Anesthetic complications: no    Last Vitals:  Filed Vitals:   05/09/15 0945 05/09/15 0950  BP: 113/54 109/69  Pulse:    Temp:    Resp: 20 23    Last Pain: There were no vitals filed for this visit.               ADAMS, AMY A

## 2015-05-09 NOTE — Op Note (Signed)
[facility] Patient Name: Gary Guzman Procedure Date: 05/09/2015 9:52 AM MRN: [id number] Date of Birth: Jun 10, 1955 Attending MD: Hildred Laser , MD CSN: [id number] Age: 60 Admit Type: Outpatient Procedure:                Colonoscopy Indications:              Screening for colorectal malignant neoplasm Providers:                Hildred Laser, MD, Gwenlyn Fudge, RN, Georgeann Oppenheim,                            Technician Referring MD:             Ms. Lennon Alstrom. Baucom, PAC Medicines:                Monitored Anesthesia Care Complications:            No immediate complications. Estimated Blood Loss:     Estimated blood loss was minimal. Procedure:                Pre-Anesthesia Assessment:                           - Prior to the procedure, a History and Physical                            was performed, and patient medications and                            allergies were reviewed. The patient's tolerance of                            previous anesthesia was also reviewed. The risks                            and benefits of the procedure and the sedation                            options and risks were discussed with the patient.                            All questions were answered, and informed consent                            was obtained. Prior Anticoagulants: The patient                            last took anticoagulant medication 2 days prior to                            the procedure. ASA Grade Assessment: II - A patient                            with mild systemic disease. After reviewing the  risks and benefits, the patient was deemed in                            satisfactory condition to undergo the procedure.                           After obtaining informed consent, the colonoscope                            was passed under direct vision. Throughout the                            procedure, the patient's blood pressure, pulse, and                      oxygen saturations were monitored continuously. The                            [id number]) scope was introduced through                            the anus and advanced to the the cecum, identified                            by appendiceal orifice and ileocecal valve. The                            colonoscopy was performed without difficulty. The                            patient tolerated the procedure well. The quality                            of the bowel preparation was adequate. The                            ileocecal valve, appendiceal orifice, and rectum                            were photographed. Scope In: 9:58:51 AM Scope Out: 10:22:58 AM Scope Withdrawal Time: 0 hours 20 minutes 6 seconds  Total Procedure Duration: 0 hours 24 minutes 7 seconds  Findings:      A 4 to 6 mm polyp was found in the transverse colon cecum. The polyp was       sessile. The polyp was removed with a cold snare. Resection and       retrieval were complete.      A single small-mouthed diverticulum was found in the hepatic flexure.      A 20 mm polyp was found in the rectum. The polyp was semi-sessile. The       polyp was removed with a piecemeal technique using a hot snare.       Resection and retrieval were complete. To stop active bleeding, one       hemostatic clip was successfully placed (MR conditional). There was no       bleeding  at the end of the procedure.      External hemorrhoids were found during retroflexion. The hemorrhoids       were small. Impression:               - One 4 to 6 mm polyp in the transverse colon in                            the cecum, removed with a cold snare. Resected and                            retrieved.                           - Diverticulosis at the hepatic flexure.                           - One 20 mm polyp in the rectum, removed piecemeal                            using a hot snare. Resected and retrieved. Clip (MR                             conditional) was placed.                           - External hemorrhoids. Moderate Sedation:      Moderate (conscious) sedation was personally administered by an       anesthesia professional. The following parameters were monitored: oxygen       saturation, heart rate, blood pressure, CO2 capnography and response to       care. Recommendation:           - Patient has a contact number available for                            emergencies. The signs and symptoms of potential                            delayed complications were discussed with the                            patient. Return to normal activities tomorrow.                            Written discharge instructions were provided to the                            patient.                           - Resume previous diet today.                           - Continue present medications.                           -  Await pathology results.                           - Repeat colonoscopy for surveillance based on                            pathology results. Procedure Code(s):        --- Professional ---                           [phone], Colonoscopy, flexible; with removal of                            tumor(s), polyp(s), or other lesion(s) by snare                            technique Diagnosis Code(s):        --- Professional ---                           Z12.11, Encounter for screening for malignant                            neoplasm of colon                           D12.3, Benign neoplasm of transverse colon (hepatic                            flexure or splenic flexure)                           D12.0, Benign neoplasm of cecum                           K62.1, Rectal polyp                           K64.4, Residual hemorrhoidal skin tags                           K57.30, Diverticulosis of large intestine without                            perforation or abscess without bleeding CPT copyright 2016 American  Medical Association. All rights reserved. The codes documented in this report are preliminary and upon coder review may  be revised to meet current compliance requirements. Hildred Laser, MD Hildred Laser, MD 05/09/2015 10:38:31 AM This report has been signed electronically. Number of Addenda: 0

## 2015-05-09 NOTE — Discharge Instructions (Signed)
Colonoscopy, Care After Refer to this sheet in the next few weeks. These instructions provide you with information on caring for yourself after your procedure. Your health care provider may also give you more specific instructions. Your treatment has been planned according to current medical practices, but problems sometimes occur. Call your health care provider if you have any problems or questions after your procedure. WHAT TO EXPECT AFTER THE PROCEDURE  After your procedure, it is typical to have the following:  A small amount of blood in your stool.  Moderate amounts of gas and mild abdominal cramping or bloating. HOME CARE INSTRUCTIONS  Do not drive, operate machinery, or sign important documents for 24 hours.  You may shower and resume your regular physical activities, but move at a slower pace for the first 24 hours.  Take frequent rest periods for the first 24 hours.  Walk around or put a warm pack on your abdomen to help reduce abdominal cramping and bloating.  Drink enough fluids to keep your urine clear or pale yellow.  You may resume your normal diet as instructed by your health care provider. Avoid heavy or fried foods that are hard to digest.  Avoid drinking alcohol for 24 hours or as instructed by your health care provider.  Only take over-the-counter or prescription medicines as directed by your health care provider.  If a tissue sample (biopsy) was taken during your procedure:  Do not take aspirin or blood thinners for 7 days, or as instructed by your health care provider.  Do not drink alcohol for 7 days, or as instructed by your health care provider.  Eat soft foods for the first 24 hours. SEEK MEDICAL CARE IF: You have persistent spotting of blood in your stool 2-3 days after the procedure. SEEK IMMEDIATE MEDICAL CARE IF:  You have more than a small spotting of blood in your stool.  You pass large blood clots in your stool.  Your abdomen is swollen  (distended).  You have nausea or vomiting.  You have a fever.  You have increasing abdominal pain that is not relieved with medicine.   This information is not intended to replace advice given to you by your health care provider. Make sure you discuss any questions you have with your health care provider.   Document Released: 09/09/2003 Document Revised: 11/15/2012 Document Reviewed: 10/02/2012 Elsevier Interactive Patient Education 2016 [employer]. Diverticulosis Diverticulosis is the condition that develops when small pouches (diverticula) form in the wall of your colon. Your colon, or large intestine, is where water is absorbed and stool is formed. The pouches form when the inside layer of your colon pushes through weak spots in the outer layers of your colon. CAUSES  No one knows exactly what causes diverticulosis. RISK FACTORS  Being older than 11. Your risk for this condition increases with age. Diverticulosis is rare in people younger than 40 years. By age 60, almost everyone has it.  Eating a low-fiber diet.  Being frequently constipated.  Being overweight.  Not getting enough exercise.  Smoking.  Taking over-the-counter pain medicines, like aspirin and ibuprofen. SYMPTOMS  Most people with diverticulosis do not have symptoms. DIAGNOSIS  Because diverticulosis often has no symptoms, health care providers often discover the condition during an exam for other colon problems. In many cases, a health care provider will diagnose diverticulosis while using a flexible scope to examine the colon (colonoscopy). TREATMENT  If you have never developed an infection related to diverticulosis, you may not need  treatment. If you have had an infection before, treatment may include:  Eating more fruits, vegetables, and grains.  Taking a fiber supplement.  Taking a live bacteria supplement (probiotic).  Taking medicine to relax your colon. HOME CARE INSTRUCTIONS   Drink at  least 6-8 glasses of water each day to prevent constipation.  Try not to strain when you have a bowel movement.  Keep all follow-up appointments. If you have had an infection before:  Increase the fiber in your diet as directed by your health care provider or dietitian.  Take a dietary fiber supplement if your health care provider approves.  Only take medicines as directed by your health care provider. SEEK MEDICAL CARE IF:   You have abdominal pain.  You have bloating.  You have cramps.  You have not gone to the bathroom in 3 days. SEEK IMMEDIATE MEDICAL CARE IF:   Your pain gets worse.  Yourbloating becomes very bad.  You have a fever or chills, and your symptoms suddenly get worse.  You begin vomiting.  You have bowel movements that are bloody or black. MAKE SURE YOU:  Understand these instructions.  Will watch your condition.  Will get help right away if you are not doing well or get worse.   This information is not intended to replace advice given to you by your health care provider. Make sure you discuss any questions you have with your health care provider.   Document Released: 10/23/2003 Document Revised: 01/30/2013 Document Reviewed: 12/20/2012 Elsevier Interactive Patient Education 2016 [employer]. Hemorrhoids Hemorrhoids are swollen veins around the rectum or anus. There are two types of hemorrhoids:   Internal hemorrhoids. These occur in the veins just inside the rectum. They may poke through to the outside and become irritated and painful.  External hemorrhoids. These occur in the veins outside the anus and can be felt as a painful swelling or hard lump near the anus. CAUSES  Pregnancy.   Obesity.   Constipation or diarrhea.   Straining to have a bowel movement.   Sitting for long periods on the toilet.  Heavy lifting or other activity that caused you to strain.  Anal intercourse. SYMPTOMS   Pain.   Anal itching or  irritation.   Rectal bleeding.   Fecal leakage.   Anal swelling.   One or more lumps around the anus.  DIAGNOSIS  Your caregiver may be able to diagnose hemorrhoids by visual examination. Other examinations or tests that may be performed include:   Examination of the rectal area with a gloved hand (digital rectal exam).   Examination of anal canal using a small tube (scope).   A blood test if you have lost a significant amount of blood.  A test to look inside the colon (sigmoidoscopy or colonoscopy). TREATMENT Most hemorrhoids can be treated at home. However, if symptoms do not seem to be getting better or if you have a lot of rectal bleeding, your caregiver may perform a procedure to help make the hemorrhoids get smaller or remove them completely. Possible treatments include:   Placing a rubber band at the base of the hemorrhoid to cut off the circulation (rubber band ligation).   Injecting a chemical to shrink the hemorrhoid (sclerotherapy).   Using a tool to burn the hemorrhoid (infrared light therapy).   Surgically removing the hemorrhoid (hemorrhoidectomy).   Stapling the hemorrhoid to block blood flow to the tissue (hemorrhoid stapling).  HOME CARE INSTRUCTIONS   Eat foods with fiber, such as  whole grains, beans, nuts, fruits, and vegetables. Ask your doctor about taking products with added fiber in them (fibersupplements).  Increase fluid intake. Drink enough water and fluids to keep your urine clear or pale yellow.   Exercise regularly.   Go to the bathroom when you have the urge to have a bowel movement. Do not wait.   Avoid straining to have bowel movements.   Keep the anal area dry and clean. Use wet toilet paper or moist towelettes after a bowel movement.   Medicated creams and suppositories may be used or applied as directed.   Only take over-the-counter or prescription medicines as directed by your caregiver.   Take warm sitz baths  for 15-20 minutes, 3-4 times a day to ease pain and discomfort.   Place ice packs on the hemorrhoids if they are tender and swollen. Using ice packs between sitz baths may be helpful.   Put ice in a plastic bag.   Place a towel between your skin and the bag.   Leave the ice on for 15-20 minutes, 3-4 times a day.   Do not use a donut-shaped pillow or sit on the toilet for long periods. This increases blood pooling and pain.  SEEK MEDICAL CARE IF:  You have increasing pain and swelling that is not controlled by treatment or medicine.  You have uncontrolled bleeding.  You have difficulty or you are unable to have a bowel movement.  You have pain or inflammation outside the area of the hemorrhoids. MAKE SURE YOU:  Understand these instructions.  Will watch your condition.  Will get help right away if you are not doing well or get worse.   This information is not intended to replace advice given to you by your health care provider. Make sure you discuss any questions you have with your health care provider.   Document Released: 01/23/2000 Document Revised: 01/12/2012 Document Reviewed: 11/30/2011 Elsevier Interactive Patient Education 2016 Elsevier Inc. High-Fiber Diet Fiber, also called dietary fiber, is a type of carbohydrate found in fruits, vegetables, whole grains, and beans. A high-fiber diet can have many health benefits. Your health care provider may recommend a high-fiber diet to help:  Prevent constipation. Fiber can make your bowel movements more regular.  Lower your cholesterol.  Relieve hemorrhoids, uncomplicated diverticulosis, or irritable bowel syndrome.  Prevent overeating as part of a weight-loss plan.  Prevent heart disease, type 2 diabetes, and certain cancers. WHAT IS MY PLAN? The recommended daily intake of fiber includes:  38 grams for men under age 60.  96 grams for men over age 59.  52 grams for women under age 78.  66 grams for women  over age 90. You can get the recommended daily intake of dietary fiber by eating a variety of fruits, vegetables, grains, and beans. Your health care provider may also recommend a fiber supplement if it is not possible to get enough fiber through your diet. WHAT DO I NEED TO KNOW ABOUT A HIGH-FIBER DIET?  Fiber supplements have not been widely studied for their effectiveness, so it is better to get fiber through food sources.  Always check the fiber content on thenutrition facts label of any prepackaged food. Look for foods that contain at least 5 grams of fiber per serving.  Ask your dietitian if you have questions about specific foods that are related to your condition, especially if those foods are not listed in the following section.  Increase your daily fiber consumption gradually. Increasing your intake of  dietary fiber too quickly may cause bloating, cramping, or gas.  Drink plenty of water. Water helps you to digest fiber. WHAT FOODS CAN I EAT? Grains Whole-grain breads. Multigrain cereal. Oats and oatmeal. Brown rice. Barley. Bulgur wheat. Buckner. Bran muffins. Popcorn. Rye wafer crackers. Vegetables Sweet potatoes. Spinach. Kale. Artichokes. Cabbage. Broccoli. Green peas. Carrots. Squash. Fruits Berries. Pears. Apples. Oranges. Avocados. Prunes and raisins. Dried figs. Meats and Other Protein Sources Navy, kidney, pinto, and soy beans. Split peas. Lentils. Nuts and seeds. Dairy Fiber-fortified yogurt. Beverages Fiber-fortified soy milk. Fiber-fortified orange juice. Other Fiber bars. The items listed above may not be a complete list of recommended foods or beverages. Contact your dietitian for more options. WHAT FOODS ARE NOT RECOMMENDED? Grains White bread. Pasta made with refined flour. White rice. Vegetables Fried potatoes. Canned vegetables. Well-cooked vegetables.  Fruits Fruit juice. Cooked, strained fruit. Meats and Other Protein Sources Fatty cuts of meat.  Fried Sales executive or fried fish. Dairy Milk. Yogurt. Cream cheese. Sour cream. Beverages Soft drinks. Other Cakes and pastries. Butter and oils. The items listed above may not be a complete list of foods and beverages to avoid. Contact your dietitian for more information. WHAT ARE SOME TIPS FOR INCLUDING HIGH-FIBER FOODS IN MY DIET?  Eat a wide variety of high-fiber foods.  Make sure that half of all grains consumed each day are whole grains.  Replace breads and cereals made from refined flour or white flour with whole-grain breads and cereals.  Replace white rice with brown rice, bulgur wheat, or millet.  Start the day with a breakfast that is high in fiber, such as a cereal that contains at least 5 grams of fiber per serving.  Use beans in place of meat in soups, salads, or pasta.  Eat high-fiber snacks, such as berries, raw vegetables, nuts, or popcorn.   This information is not intended to replace advice given to you by your health care provider. Make sure you discuss any questions you have with your health care provider.   Document Released: 01/25/2005 Document Revised: 02/15/2014 Document Reviewed: 07/10/2013 Elsevier Interactive Patient Education [employer].

## 2015-05-12 ENCOUNTER — Encounter (HOSPITAL_COMMUNITY): Payer: Self-pay | Admitting: Internal Medicine

## 2015-06-10 ENCOUNTER — Ambulatory Visit (INDEPENDENT_AMBULATORY_CARE_PROVIDER_SITE_OTHER): Payer: Medicare HMO | Admitting: Neurology

## 2015-06-10 ENCOUNTER — Encounter: Payer: Self-pay | Admitting: Neurology

## 2015-06-10 VITALS — BP 138/73 | HR 65 | Ht 72.0 in | Wt 259.5 lb

## 2015-06-10 DIAGNOSIS — G629 Polyneuropathy, unspecified: Secondary | ICD-10-CM | POA: Insufficient documentation

## 2015-06-10 DIAGNOSIS — IMO0002 Reserved for concepts with insufficient information to code with codable children: Secondary | ICD-10-CM

## 2015-06-10 DIAGNOSIS — R55 Syncope and collapse: Secondary | ICD-10-CM

## 2015-06-10 DIAGNOSIS — G6289 Other specified polyneuropathies: Secondary | ICD-10-CM
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
## 2015-06-19 ENCOUNTER — Telehealth (INDEPENDENT_AMBULATORY_CARE_PROVIDER_SITE_OTHER): Payer: Self-pay | Admitting: Internal Medicine

## 2015-06-19 NOTE — Telephone Encounter (Signed)
Per Dr.Rehman - if patient wants to have this done , we will do plain film of abdomen.

## 2015-06-19 NOTE — Telephone Encounter (Signed)
Ann- Do you know anything about this? If I can help I will,just let me know.

## 2015-06-19 NOTE — Telephone Encounter (Signed)
I've never known Dr Laural Golden to do this, I think clips pass on their own.  I guess ask him to see if it passes on it's own or does patient need xray and if so what kind of xray

## 2015-06-19 NOTE — Telephone Encounter (Signed)
Patient called, requesting an order for an x-ray to see if the clip Dr. Laural Golden put on his polyp is gone.  [phone]

## 2015-06-20 ENCOUNTER — Ambulatory Visit (INDEPENDENT_AMBULATORY_CARE_PROVIDER_SITE_OTHER): Payer: Medicare HMO | Admitting: Urology

## 2015-06-20 ENCOUNTER — Ambulatory Visit (HOSPITAL_COMMUNITY)
Admission: RE | Admit: 2015-06-20 | Discharge: 2015-06-20 | Disposition: A | Discharge status: Home / Self Care | Payer: Medicare HMO | Source: Ambulatory Visit | Attending: Internal Medicine | Admitting: Internal Medicine

## 2015-06-20 ENCOUNTER — Other Ambulatory Visit (INDEPENDENT_AMBULATORY_CARE_PROVIDER_SITE_OTHER): Payer: Self-pay | Admitting: Internal Medicine

## 2015-06-20 DIAGNOSIS — N401 Enlarged prostate with lower urinary tract symptoms: Secondary | ICD-10-CM

## 2015-06-20 DIAGNOSIS — Z1389 Encounter for screening for other disorder: Secondary | ICD-10-CM | POA: Insufficient documentation

## 2015-06-20 DIAGNOSIS — N5201 Erectile dysfunction due to arterial insufficiency: Secondary | ICD-10-CM | POA: Diagnosis not present

## 2015-06-20 DIAGNOSIS — T184XXA Foreign body in colon, initial encounter: Secondary | ICD-10-CM

## 2015-06-20 DIAGNOSIS — Z9889 Other specified postprocedural states: Secondary | ICD-10-CM | POA: Diagnosis present

## 2015-06-20 DIAGNOSIS — R351 Nocturia: Secondary | ICD-10-CM

## 2015-06-20 DIAGNOSIS — T183XXA Foreign body in small intestine, initial encounter: Secondary | ICD-10-CM

## 2015-06-20 NOTE — Telephone Encounter (Signed)
Patient aware, ok to have xray, no appt needed for this

## 2015-06-24 ENCOUNTER — Encounter: Payer: Medicare HMO | Admitting: Neurology

## 2015-07-08 ENCOUNTER — Other Ambulatory Visit: Payer: Medicare HMO

## 2015-07-09 ENCOUNTER — Encounter: Payer: Self-pay | Admitting: Neurology

## 2015-07-24 ENCOUNTER — Encounter: Payer: Medicare HMO | Admitting: Neurology

## 2015-08-13 ENCOUNTER — Ambulatory Visit (INDEPENDENT_AMBULATORY_CARE_PROVIDER_SITE_OTHER): Payer: Medicare HMO | Admitting: Urology

## 2015-08-13 DIAGNOSIS — N5201 Erectile dysfunction due to arterial insufficiency: Secondary | ICD-10-CM | POA: Diagnosis not present

## 2015-08-13 DIAGNOSIS — N401 Enlarged prostate with lower urinary tract symptoms: Secondary | ICD-10-CM

## 2015-08-19 ENCOUNTER — Other Ambulatory Visit: Payer: Medicare HMO

## 2015-11-19 ENCOUNTER — Ambulatory Visit: Payer: Medicare HMO | Admitting: Urology

## 2015-11-20 ENCOUNTER — Ambulatory Visit (HOSPITAL_BASED_OUTPATIENT_CLINIC_OR_DEPARTMENT_OTHER): Payer: Medicare HMO | Admitting: Physical Medicine & Rehabilitation

## 2015-11-20 ENCOUNTER — Encounter: Payer: Self-pay | Admitting: Physical Medicine & Rehabilitation

## 2015-11-20 ENCOUNTER — Encounter: Payer: Medicare HMO | Attending: Physical Medicine & Rehabilitation

## 2015-11-20 VITALS — BP 150/84 | HR 74 | Resp 14

## 2015-11-20 DIAGNOSIS — G894 Chronic pain syndrome: Secondary | ICD-10-CM

## 2015-11-20 DIAGNOSIS — Z5181 Encounter for therapeutic drug level monitoring: Secondary | ICD-10-CM | POA: Diagnosis not present

## 2015-11-20 DIAGNOSIS — M545 Low back pain: Secondary | ICD-10-CM | POA: Insufficient documentation

## 2015-11-20 DIAGNOSIS — G6289 Other specified polyneuropathies: Secondary | ICD-10-CM | POA: Diagnosis not present

## 2015-11-20 DIAGNOSIS — G8929 Other chronic pain: Secondary | ICD-10-CM | POA: Insufficient documentation

## 2015-11-20 DIAGNOSIS — Z79899 Other long term (current) drug therapy: Secondary | ICD-10-CM | POA: Diagnosis not present

## 2015-11-20 NOTE — Patient Instructions (Addendum)
Non medication pain relief  Try ice 39min alternating with heating pad for 20 minutes every 2 hours as needed  Try TENs unit that can be puchased in a pharmacy for about 40.00, brands include Aleve or Icy Hot  Try various muscle cremes  Aspercreme or others that contain trolamine- this is anti inflammatory  Capsaicin creme which reduces Pain substance P  Lidocaine containing creme which has a numbing medicine  Menthol and camphor containing creme which has a cooling effect   If urine screen looks okay next week we can call in some Tylenol with Codeine and then schedule you back for repeat visit with nurse practitioner in one month
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
## 2015-11-21 ENCOUNTER — Ambulatory Visit: Payer: Medicare HMO | Admitting: Physical Medicine & Rehabilitation

## 2015-11-27 ENCOUNTER — Telehealth: Payer: Self-pay | Admitting: *Deleted

## 2015-11-27 LAB — TOXASSURE SELECT,+ANTIDEPR,UR

## 2015-11-27 MED ORDER — ACETAMINOPHEN-CODEINE #4 300-60 MG PO TABS: 1.0000 | ORAL_TABLET | Freq: Three times a day (TID) | ORAL | 0 refills | Status: DC | PRN

## 2015-11-27 NOTE — Telephone Encounter (Signed)
Please: Tylenol No. 4 one tablet 3 times a day, #90, no refill

## 2015-11-27 NOTE — Progress Notes (Signed)
Urine drug screen for this encounter is consistent for prescribed medication 

## 2015-11-27 NOTE — Telephone Encounter (Signed)
Patient's UDS came back consistent. His last visit was a  NEW VISIT. With a clean UDS , are we going to prescribe tylenol #4's.Marland KitchenMarland KitchenMarland KitchenMarland Kitchenplease advise

## 2015-11-27 NOTE — Telephone Encounter (Signed)
Called to pharmacy and Gary Guzman notified.

## 2015-12-03 ENCOUNTER — Telehealth: Payer: Self-pay | Admitting: *Deleted

## 2015-12-03 NOTE — Telephone Encounter (Signed)
Patient left a message stating that the codeine and the tylenol the doctor prescribed is not helping. He is asking if it can be increased or if there might be something else?

## 2015-12-03 NOTE — Telephone Encounter (Signed)
Patient left a message stating that the codine and tylenol that the

## 2015-12-04 ENCOUNTER — Telehealth: Payer: Self-pay

## 2015-12-04 MED ORDER — ACETAMINOPHEN-CODEINE #4 300-60 MG PO TABS: 2.0000 | ORAL_TABLET | Freq: Three times a day (TID) | ORAL | 0 refills | Status: DC | PRN

## 2015-12-04 NOTE — Telephone Encounter (Signed)
Increase tylenol #4 to 2 po TID

## 2015-12-04 NOTE — Telephone Encounter (Addendum)
Per Dr.Kirsteins, patient can increase tylenol #4 to 2 by mouth 3 times a day.  Patient is aware.  Note made on current prescription  that the increase has been made, no new order was placed.

## 2015-12-18 ENCOUNTER — Encounter: Payer: Medicare HMO | Admitting: Registered Nurse

## 2015-12-28 ENCOUNTER — Emergency Department (HOSPITAL_COMMUNITY): Payer: Medicare HMO

## 2015-12-28 ENCOUNTER — Encounter (HOSPITAL_COMMUNITY): Payer: Self-pay | Admitting: Emergency Medicine

## 2015-12-28 ENCOUNTER — Emergency Department (HOSPITAL_COMMUNITY)
Admission: EM | Admit: 2015-12-28 | Discharge: 2015-12-28 | Disposition: A | Discharge status: Home / Self Care | Payer: Medicare HMO | Attending: Emergency Medicine | Admitting: Emergency Medicine

## 2015-12-28 DIAGNOSIS — Z7982 Long term (current) use of aspirin: Secondary | ICD-10-CM | POA: Diagnosis not present

## 2015-12-28 DIAGNOSIS — N189 Chronic kidney disease, unspecified: Secondary | ICD-10-CM | POA: Diagnosis not present

## 2015-12-28 DIAGNOSIS — R51 Headache: Secondary | ICD-10-CM

## 2015-12-28 DIAGNOSIS — R519 Headache, unspecified: Secondary | ICD-10-CM

## 2015-12-28 DIAGNOSIS — Z79899 Other long term (current) drug therapy: Secondary | ICD-10-CM | POA: Insufficient documentation

## 2015-12-28 DIAGNOSIS — I129 Hypertensive chronic kidney disease with stage 1 through stage 4 chronic kidney disease, or unspecified chronic kidney disease: Secondary | ICD-10-CM | POA: Insufficient documentation

## 2015-12-28 DIAGNOSIS — Z87891 Personal history of nicotine dependence: Secondary | ICD-10-CM | POA: Diagnosis not present

## 2015-12-28 DIAGNOSIS — I2510 Atherosclerotic heart disease of native coronary artery without angina pectoris: Secondary | ICD-10-CM | POA: Diagnosis not present

## 2015-12-28 DIAGNOSIS — I10 Essential (primary) hypertension: Secondary | ICD-10-CM

## 2015-12-28 MED ORDER — OXYCODONE-ACETAMINOPHEN 5-325 MG PO TABS
1.0000 | ORAL_TABLET | Freq: Once | ORAL | Status: AC
  Administered 2015-12-28: 1 via ORAL
  Filled 2015-12-28: qty 1

## 2015-12-28 MED ORDER — IBUPROFEN 400 MG PO TABS
600.0000 mg | ORAL_TABLET | Freq: Once | ORAL | Status: DC
  Filled 2015-12-28: qty 2

## 2015-12-28 NOTE — ED Notes (Signed)
Pt reports he checked his BP at home with a wrist monitor getting readings of [id number], states systolic never got below [id number]. States HA started this morning in back of head, no vision changes, deficits, or N/V/.

## 2015-12-28 NOTE — ED Triage Notes (Signed)
Pt reports he developed a headache earlier today. Upon checking his BP pt reports elevated pressures.

## 2015-12-28 NOTE — ED Notes (Signed)
This is second time pt has called out for pain medication. States "I only received half, since I can't take ibuprofen. Tell that doctor someone on blood thinners can't take ibuprofen".

## 2015-12-28 NOTE — ED Provider Notes (Signed)
[facility] DEPT Provider Note   CSN: [id number] Arrival date & time: 12/28/15  1842   By signing my name below, I, Eunice Blase, attest that this documentation has been prepared under the direction and in the presence of Jola Schmidt, MD. Electronically signed, Eunice Blase, ED Scribe. 12/28/15. 9:04 PM.   History   Chief Complaint Chief Complaint  Patient presents with  . Hypertension   The history is provided by the patient and the spouse. No language interpreter was used.   HPI Comments: Gary Guzman is a 60 y.o. male with PMHx of multiple stents who presents to the Emergency Department complaining of sudden onset, constant, unchanged headache since 11:00AM today. Pt reports associated moderate headache beginning at 11:00AM today, elevated blood pressure ( subjective 210/180 since noon today), and chest pain. Pt states that he seldom has headaches. Per spouse, he took an extra celexa and an extra effient last night, had a high-sodium meal last night, and he has not eaten much today. Per spouse, he had half of his prescribed quinovin, apple cider vinegar, gabapentin, and quinapril this morning. Pt has not taken any medications to alleviate his headache today. Last checkup with PCP was 12/25/2015, and he notes NL blood pressure on that evaluation. Pt denies activity change, N/V/D, head injury, visual disturbances, weakness and confusion. PCP at [facility].    Past Medical History:  Diagnosis Date  . Arthritis   . Chronic kidney disease   . Coronary artery disease   . Depression   . Dizziness   . Heart disease   . High cholesterol   . Hypertension   . Kidney stones   . Polyp of colon     Patient Active Problem List   Diagnosis Date Noted  . Passed out 06/10/2015  . Peripheral neuropathy ([place]) 06/10/2015  . Chronic midline low back pain 11/04/2010    Past Surgical History:  Procedure Laterality Date  . CARDIAC CATHETERIZATION    . COLONOSCOPY WITH  PROPOFOL N/A 05/09/2015   Procedure: COLONOSCOPY WITH PROPOFOL;  Surgeon: Rogene Houston, MD;  Location: [facility];  Service: Endoscopy;  Laterality: N/A;  10:30 - Ann to notify pt to arrive at 7:45  . CORONARY STENT PLACEMENT  10/04/13  . kidney stones         Home Medications    Prior to Admission medications   Medication Sig Start Date End Date Taking? Authorizing Provider  aspirin 81 MG tablet Take 1 tablet (81 mg total) by mouth daily. 05/10/15   Rogene Houston, MD  citalopram (CELEXA) 40 MG tablet Take 40 mg by mouth daily.    Historical Provider, MD  clonazePAM (KLONOPIN) 1 MG tablet Take 1 mg by mouth 2 (two) times daily.    Historical Provider, MD  gabapentin (NEURONTIN) 600 MG tablet Take 600 mg by mouth 2 (two) times daily. 600 in am and 1200 at night    Historical Provider, MD  hydrochlorothiazide (HYDRODIURIL) 25 MG tablet Take 25 mg by mouth daily.    Historical Provider, MD  metoprolol tartrate (LOPRESSOR) 25 MG tablet Take 25 mg by mouth 2 (two) times daily.    Historical Provider, MD  nortriptyline (PAMELOR) 50 MG capsule Take 50 mg by mouth at bedtime.    Historical Provider, MD  prasugrel (EFFIENT) 10 MG TABS tablet Take 1 tablet (10 mg total) by mouth daily. 05/10/15   Rogene Houston, MD  rosuvastatin (CRESTOR) 40 MG tablet Take 40 mg by mouth daily.  Historical Provider, MD    Family History Family History  Problem Relation Age of Onset  . Emphysema Father   . Alzheimer's disease Mother   . Hypertension Mother   . Hyperlipidemia Mother     Social History Social History  Substance Use Topics  . Smoking status: Former Research scientist (life sciences)  . Smokeless tobacco: Never Used     Comment: Quit 12 years ago.  . Alcohol use No     Comment: quit 12 yrs ago     Allergies   Atorvastatin and Codeine   Review of Systems Review of Systems  Constitutional: Negative for activity change.  Eyes: Negative for visual disturbance.  Cardiovascular: Positive for chest pain.    Gastrointestinal: Negative for diarrhea, nausea and vomiting.  Neurological: Positive for headaches. Negative for weakness.  Psychiatric/Behavioral: Negative for confusion.     Physical Exam Updated Vital Signs BP 184/82 (BP Location: Left Arm)   Pulse 62   Temp 97.7 F (36.5 C) (Oral)   Resp 16   Ht 6' (1.829 m)   Wt 264 lb (119.7 kg)   SpO2 97%   BMI 35.80 kg/m   Physical Exam  Constitutional: He is oriented to person, place, and time. He appears well-developed and well-nourished.  HENT:  Head: Normocephalic and atraumatic.  Eyes: EOM are normal. Pupils are equal, round, and reactive to light.  Neck: Normal range of motion.  Cardiovascular: Normal rate, regular rhythm, normal heart sounds and intact distal pulses.   Pulmonary/Chest: Effort normal and breath sounds normal. No respiratory distress.  Abdominal: Soft. He exhibits no distension. There is no tenderness.  Musculoskeletal: Normal range of motion.  Neurological: He is alert and oriented to person, place, and time.  5/5 strength in major muscle groups of  bilateral upper and lower extremities. Speech normal. No facial asymetry.   Skin: Skin is warm and dry.  Psychiatric: He has a normal mood and affect. Judgment normal.  Nursing note and vitals reviewed.    ED Treatments / Results  DIAGNOSTIC STUDIES: Oxygen Saturation is 97% on RA, normal by my interpretation.    COORDINATION OF CARE: 9:04 PM Will order imaging. Discussed treatment plan with pt at bedside and pt agreed to plan.   Labs (all labs ordered are listed, but only abnormal results are displayed) Labs Reviewed - No data to display  EKG  EKG Interpretation None       Radiology Ct Head Wo Contrast  Result Date: 12/28/2015 CLINICAL DATA:  Headache with hypertension EXAM: CT HEAD WITHOUT CONTRAST TECHNIQUE: Contiguous axial images were obtained from the base of the skull through the vertex without intravenous contrast. COMPARISON:  None.  FINDINGS: Brain: There is slight diffuse atrophy. There is no intracranial mass, hemorrhage, extra-axial fluid collection, or midline shift. There is mild small vessel disease in the centra semiovale bilaterally, primarily posteriorly. Elsewhere gray-white compartments are normal. No acute infarct is evident. Vascular: No hyperdense vessel. There are foci of calcification in each carotid siphon region. Skull: The bony calvarium appears intact. Sinuses/Orbits: Paranasal sinuses are clear. Orbits appear symmetric bilaterally. Other: Mastoid air cells clear. IMPRESSION: Slight atrophy with mild periventricular small vessel disease. No intracranial mass, hemorrhage, or extra-axial fluid collection. No acute infarct. Foci of calcification in the carotid siphon regions. Electronically Signed   By: Lowella Grip III M.D.   On: 12/28/2015 21:54    Procedures Procedures   Medications Ordered in ED Medications  oxyCODONE-acetaminophen (PERCOCET/ROXICET) 5-325 MG per tablet 1 tablet (1 tablet Oral Given  12/28/15 2051)  oxyCODONE-acetaminophen (PERCOCET/ROXICET) 5-325 MG per tablet 1 tablet (1 tablet Oral Given 12/28/15 2208)     Initial Impression / Assessment and Plan / ED Course  I have reviewed the triage vital signs and the nursing notes.  Pertinent labs & imaging results that were available during my care of the patient were reviewed by me and considered in my medical decision making (see chart for details).  Clinical Course     Patient sac significantly improved at this time.  Blood pressure improving emergency department without treatment.  He took his home dose of metoprolol while I was with him in the emergency department at my request.  I've asked that he follow-up with his doctor regarding his blood pressure.  No changes to his medications will be made today.  No focal neuro deficit.  I recommended a healthy diet and daily walking  Final Clinical Impressions(s) / ED Diagnoses   Final  diagnoses:  Nonintractable headache, unspecified chronicity pattern, unspecified headache type  Hypertension, unspecified type    New Prescriptions New Prescriptions   No medications on file   I personally performed the services described in this documentation, which was scribed in my presence. The recorded information has been reviewed and is accurate.        Jola Schmidt, MD 12/28/15 [phone]

## 2017-05-03 IMAGING — CT CT HEAD W/O CM
3 series · 16 of 47 positions shown, 19 images · non-contrast
Comparison: None.

CLINICAL DATA: Headache with hypertension

EXAM:
CT HEAD WITHOUT CONTRAST
TECHNIQUE: Contiguous axial images were obtained from the base of the skull
through the vertex without intravenous contrast.

[Series 2: head wo · axial · 0.46mm/px · z∈[+49,+179]mm · 10 of 32 slices shown, 13 images]
[im 3/32  brain]
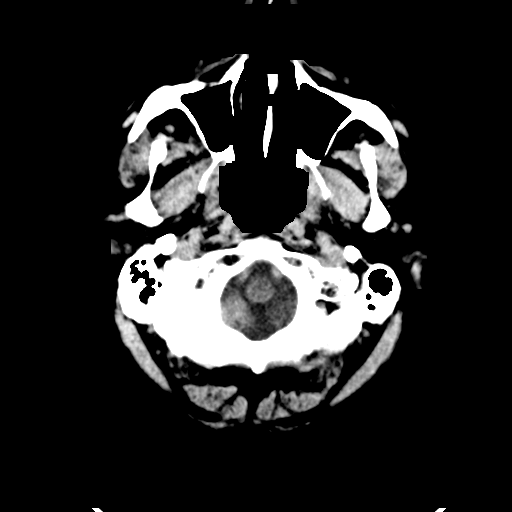
[im 3/32  bone]
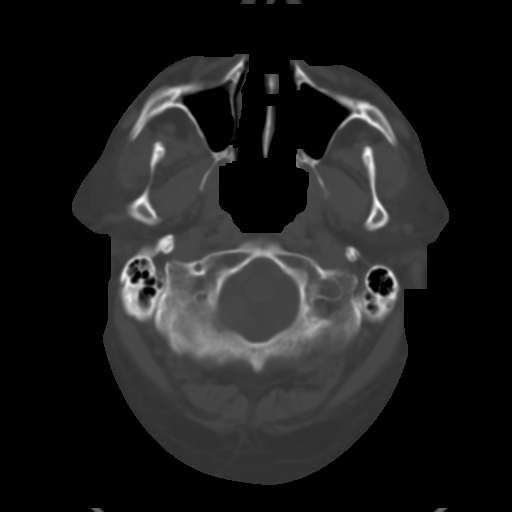
[im 6/32  brain]
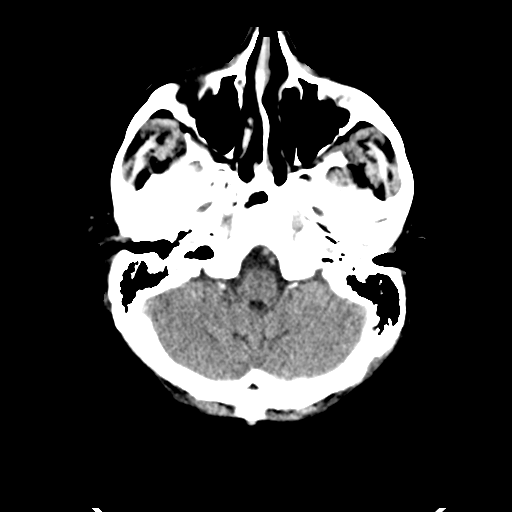
[im 9/32  brain]
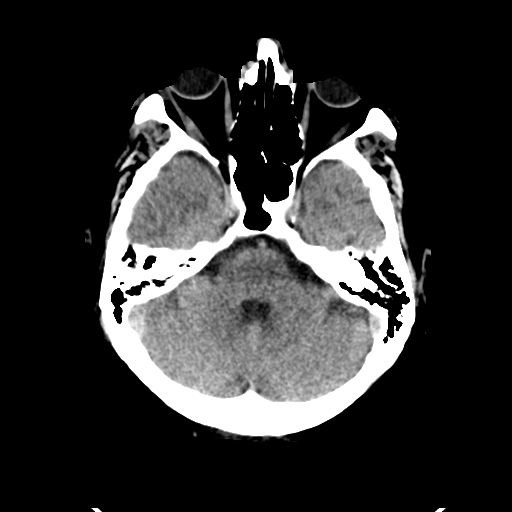
[im 11/32  brain]
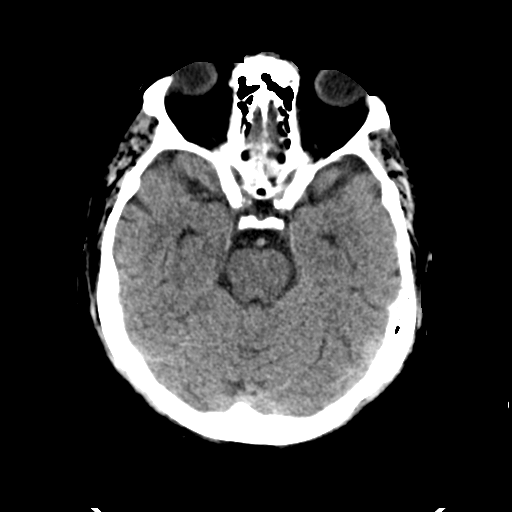
[im 14/32  brain]
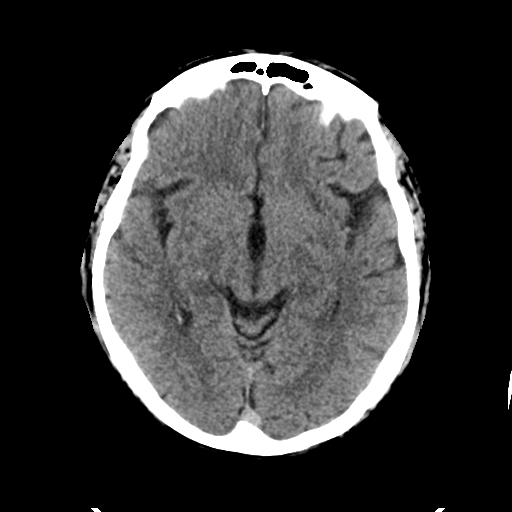
[im 14/32  bone]
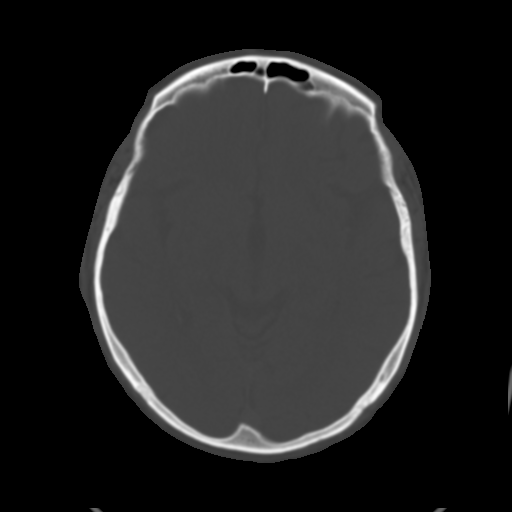
[im 18/32  brain]
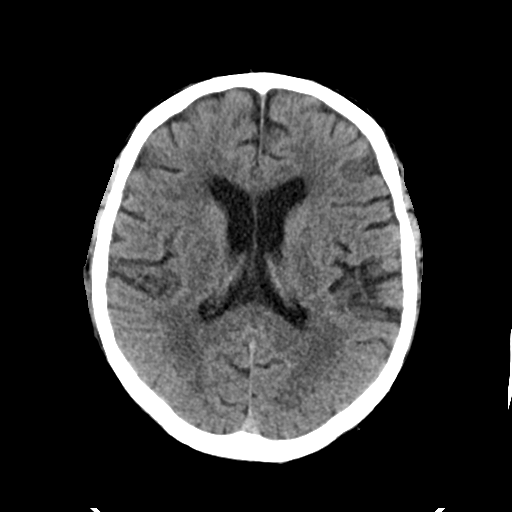
[im 21/32  brain]
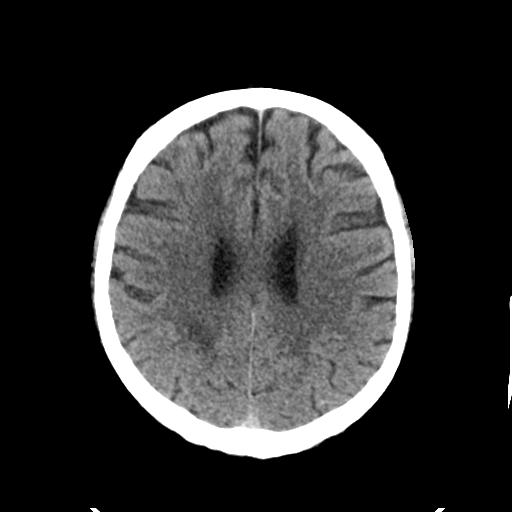
[im 24/32  brain]
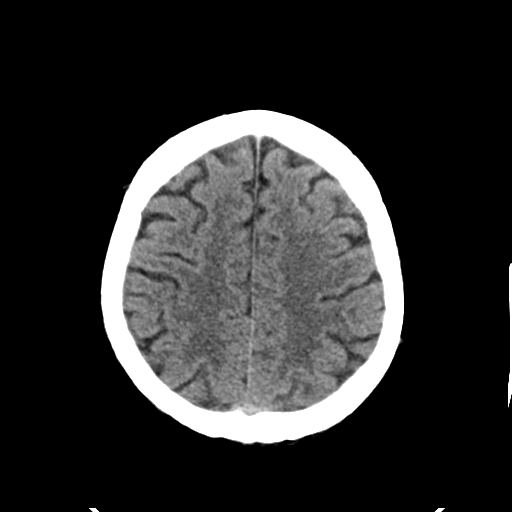
[im 26/32  brain]
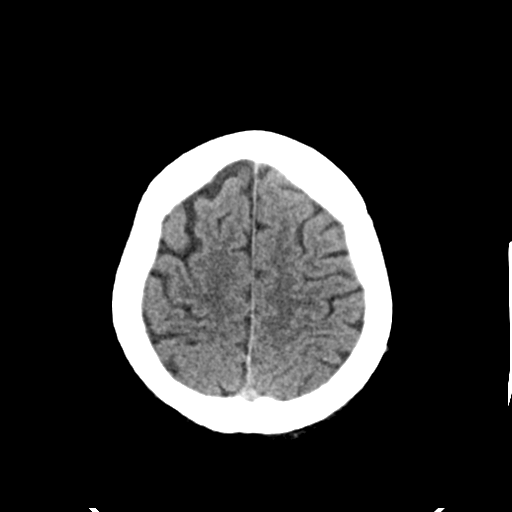
[im 26/32  bone]
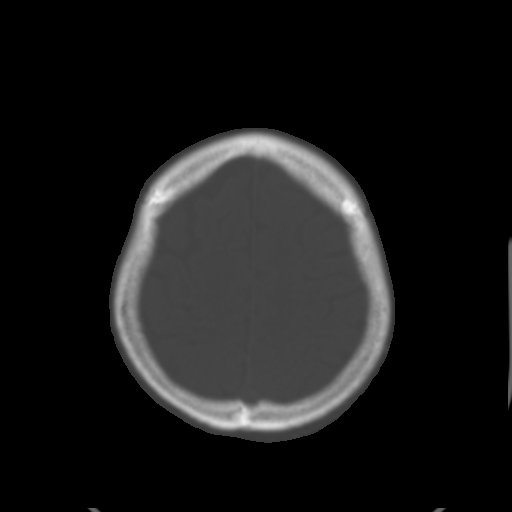
[im 29/32  brain]
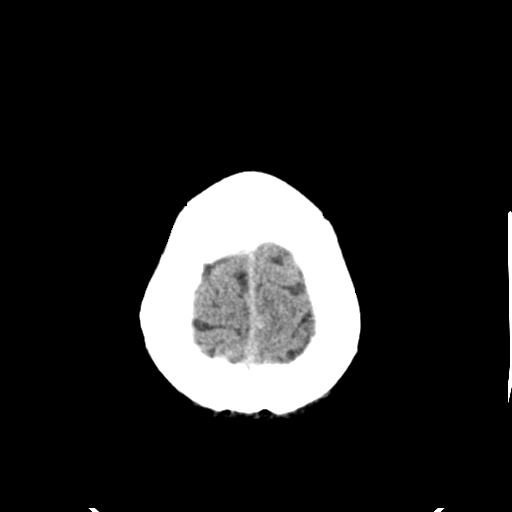

[Series 4: coronal soft tissue · coronal · 0.39mm/px · 3 of 77 slices shown]
[im 26/77  brain]
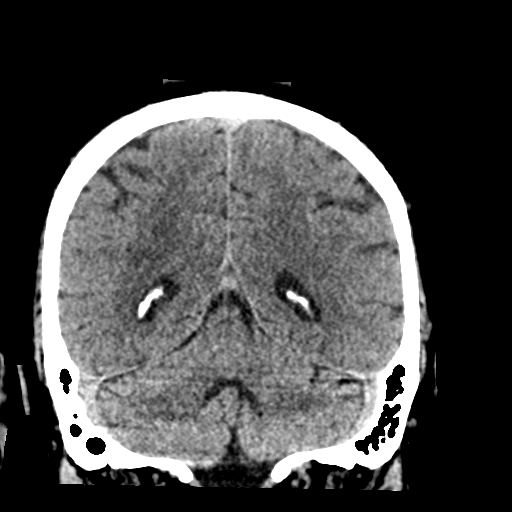
[im 34/77  brain]
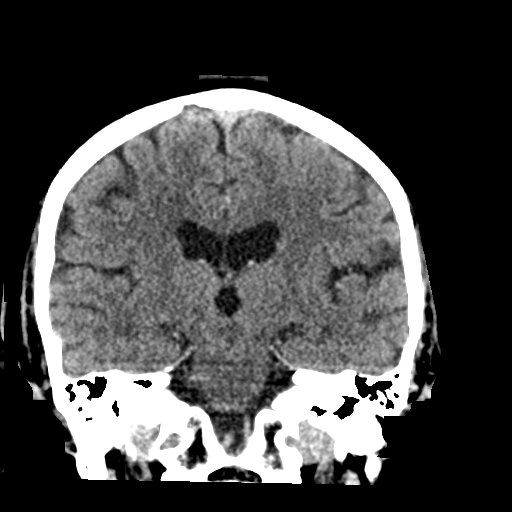
[im 43/77  brain]
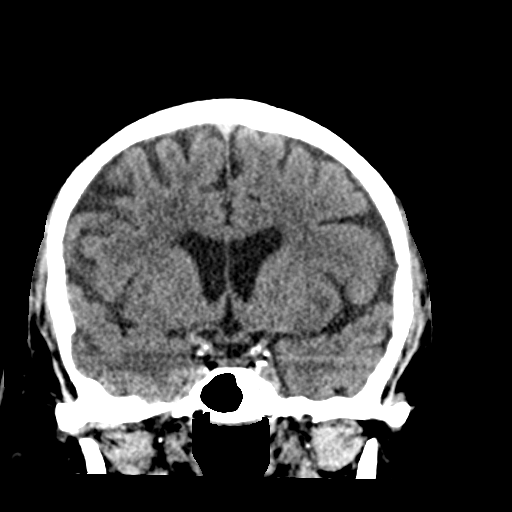

[Series 5: sagittal soft tissue · sagittal · 0.37mm/px · 3 of 67 slices shown]
[im 23/67  brain]
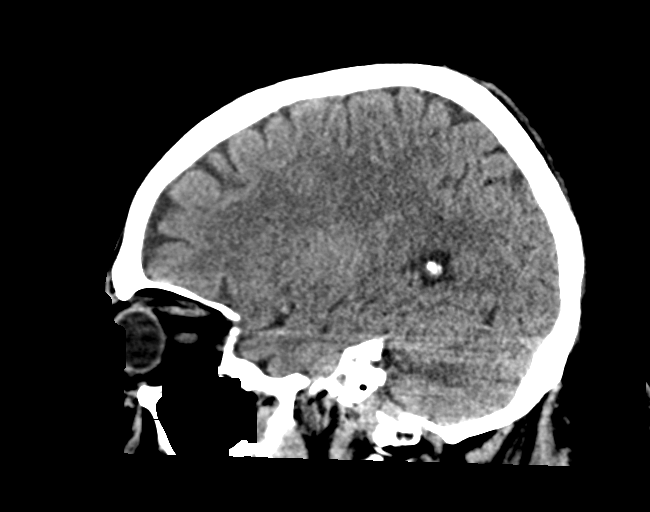
[im 34/67  brain]
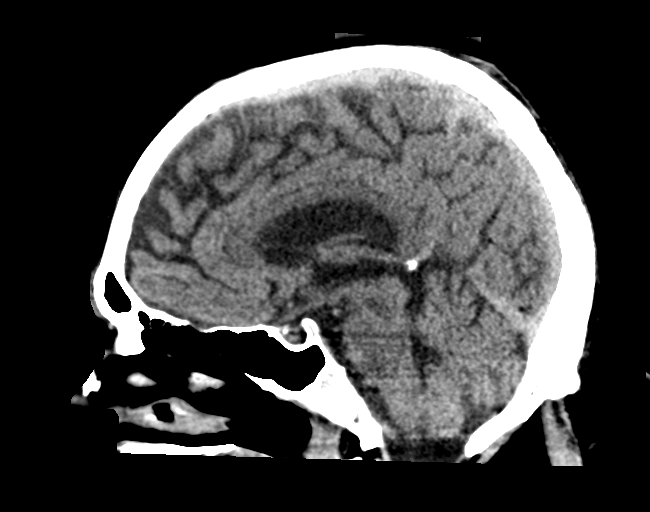
[im 45/67  brain]
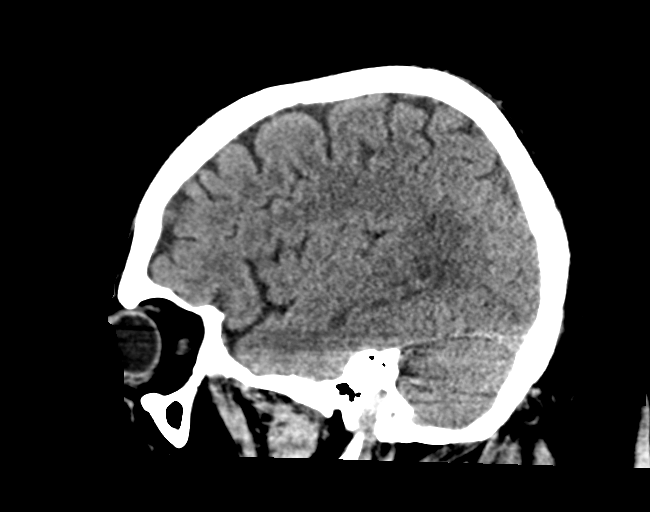

[16 of 47 positions shown; findings below may reference images not displayed]

FINDINGS: Brain: There is slight diffuse atrophy. There is no intracranial
mass, hemorrhage, extra-axial fluid collection, or midline shift.
There is mild small vessel disease in the centra semiovale
bilaterally, primarily posteriorly. Elsewhere gray-white
compartments are normal. No acute infarct is evident.

Vascular: No hyperdense vessel. There are foci of calcification in
each carotid siphon region.

Skull: The bony calvarium appears intact.

Sinuses/Orbits: Paranasal sinuses are clear. Orbits appear symmetric
bilaterally.

Other: Mastoid air cells clear.
IMPRESSION: Slight atrophy with mild periventricular small vessel disease. No
intracranial mass, hemorrhage, or extra-axial fluid collection. No
acute infarct. Foci of calcification in the carotid siphon regions.

## 2017-07-18 ENCOUNTER — Ambulatory Visit (INDEPENDENT_AMBULATORY_CARE_PROVIDER_SITE_OTHER): Payer: Medicare HMO | Admitting: Otolaryngology

## 2017-07-18 DIAGNOSIS — J343 Hypertrophy of nasal turbinates: Secondary | ICD-10-CM | POA: Diagnosis not present

## 2017-07-18 DIAGNOSIS — J342 Deviated nasal septum: Secondary | ICD-10-CM | POA: Diagnosis not present

## 2017-09-01 ENCOUNTER — Ambulatory Visit (INDEPENDENT_AMBULATORY_CARE_PROVIDER_SITE_OTHER): Payer: Medicare HMO | Admitting: Otolaryngology

## 2017-09-05 ENCOUNTER — Ambulatory Visit (INDEPENDENT_AMBULATORY_CARE_PROVIDER_SITE_OTHER): Payer: Medicare HMO | Admitting: Otolaryngology

## 2019-03-05 ENCOUNTER — Emergency Department (HOSPITAL_COMMUNITY)
Admission: EM | Admit: 2019-03-05 | Discharge: 2019-03-05 | Disposition: A | Discharge status: Home / Self Care | Payer: Medicare HMO | Attending: Emergency Medicine | Admitting: Emergency Medicine

## 2019-03-05 ENCOUNTER — Other Ambulatory Visit: Payer: Self-pay

## 2019-03-05 ENCOUNTER — Emergency Department (HOSPITAL_COMMUNITY): Payer: Medicare HMO

## 2019-03-05 ENCOUNTER — Encounter (HOSPITAL_COMMUNITY): Payer: Self-pay | Admitting: *Deleted

## 2019-03-05 DIAGNOSIS — Z7982 Long term (current) use of aspirin: Secondary | ICD-10-CM | POA: Diagnosis not present

## 2019-03-05 DIAGNOSIS — Z79899 Other long term (current) drug therapy: Secondary | ICD-10-CM | POA: Insufficient documentation

## 2019-03-05 DIAGNOSIS — I2510 Atherosclerotic heart disease of native coronary artery without angina pectoris: Secondary | ICD-10-CM | POA: Insufficient documentation

## 2019-03-05 DIAGNOSIS — I129 Hypertensive chronic kidney disease with stage 1 through stage 4 chronic kidney disease, or unspecified chronic kidney disease: Secondary | ICD-10-CM | POA: Diagnosis not present

## 2019-03-05 DIAGNOSIS — R0789 Other chest pain: Secondary | ICD-10-CM | POA: Diagnosis not present

## 2019-03-05 DIAGNOSIS — R0602 Shortness of breath: Secondary | ICD-10-CM | POA: Insufficient documentation

## 2019-03-05 DIAGNOSIS — N189 Chronic kidney disease, unspecified: Secondary | ICD-10-CM | POA: Diagnosis not present

## 2019-03-05 DIAGNOSIS — Z87891 Personal history of nicotine dependence: Secondary | ICD-10-CM | POA: Diagnosis not present

## 2019-03-05 DIAGNOSIS — R519 Headache, unspecified: Secondary | ICD-10-CM | POA: Insufficient documentation

## 2019-03-05 DIAGNOSIS — I10 Essential (primary) hypertension: Secondary | ICD-10-CM

## 2019-03-05 LAB — CBC WITH DIFFERENTIAL/PLATELET
Abs Immature Granulocytes: 0.01 10*3/uL (ref 0.00–0.07)
Basophils Absolute: 0 10*3/uL (ref 0.0–0.1)
Basophils Relative: 0 %
Eosinophils Absolute: 0.1 10*3/uL (ref 0.0–0.5)
Eosinophils Relative: 2 %
HCT: 43.2 % (ref 39.0–52.0)
Hemoglobin: 13.8 g/dL (ref 13.0–17.0)
Immature Granulocytes: 0 %
Lymphocytes Relative: 27 %
Lymphs Abs: 1.2 10*3/uL (ref 0.7–4.0)
MCH: 29.3 pg (ref 26.0–34.0)
MCHC: 31.9 g/dL (ref 30.0–36.0)
MCV: 91.7 fL (ref 80.0–100.0)
Monocytes Absolute: 0.4 10*3/uL (ref 0.1–1.0)
Monocytes Relative: 8 %
Neutro Abs: 2.7 10*3/uL (ref 1.7–7.7)
Neutrophils Relative %: 63 %
Platelets: 144 10*3/uL — ABNORMAL LOW (ref 150–400)
RBC: 4.71 MIL/uL (ref 4.22–5.81)
RDW: 13.8 % (ref 11.5–15.5)
WBC: 4.4 10*3/uL (ref 4.0–10.5)
nRBC: 0 % (ref 0.0–0.2)

## 2019-03-05 LAB — BASIC METABOLIC PANEL
Anion gap: 11 (ref 5–15)
BUN: 14 mg/dL (ref 8–23)
CO2: 28 mmol/L (ref 22–32)
Calcium: 8.8 mg/dL — ABNORMAL LOW (ref 8.9–10.3)
Chloride: 104 mmol/L (ref 98–111)
Creatinine, Ser: 1.18 mg/dL (ref 0.61–1.24)
GFR calc Af Amer: >60 mL/min (ref >60)
GFR calc non Af Amer: >60 mL/min (ref >60)
Glucose, Bld: 125 mg/dL — ABNORMAL HIGH (ref 70–99)
Potassium: 3.5 mmol/L (ref 3.5–5.1)
Sodium: 143 mmol/L (ref 135–145)

## 2019-03-05 LAB — URINALYSIS, ROUTINE W REFLEX MICROSCOPIC
Bacteria, UA: NONE SEEN
Bilirubin Urine: NEGATIVE
Glucose, UA: NEGATIVE mg/dL
Ketones, ur: NEGATIVE mg/dL
Leukocytes,Ua: NEGATIVE
Nitrite: NEGATIVE
Protein, ur: 30 mg/dL — AB
Specific Gravity, Urine: 1.016 (ref 1.005–1.030)
pH: 6 (ref 5.0–8.0)

## 2019-03-05 LAB — TROPONIN I (HIGH SENSITIVITY)
Troponin I (High Sensitivity): 10 ng/L (ref <18)
Troponin I (High Sensitivity): 12 ng/L (ref <18)

## 2019-03-05 LAB — BRAIN NATRIURETIC PEPTIDE: B Natriuretic Peptide: 123 pg/mL — ABNORMAL HIGH (ref 0.0–100.0)

## 2019-03-05 MED ORDER — DIPHENHYDRAMINE HCL 50 MG/ML IJ SOLN
12.5000 mg | Freq: Once | INTRAMUSCULAR | Status: AC
  Administered 2019-03-05: 12.5 mg via INTRAVENOUS
  Filled 2019-03-05: qty 1

## 2019-03-05 MED ORDER — FUROSEMIDE 10 MG/ML IJ SOLN
40.0000 mg | Freq: Once | INTRAMUSCULAR | Status: AC
  Administered 2019-03-05: 22:00:00 40 mg via INTRAVENOUS
  Filled 2019-03-05: qty 4

## 2019-03-05 MED ORDER — PROCHLORPERAZINE EDISYLATE 10 MG/2ML IJ SOLN
10.0000 mg | Freq: Once | INTRAMUSCULAR | Status: AC
  Administered 2019-03-05: 10 mg via INTRAVENOUS
  Filled 2019-03-05: qty 2

## 2019-03-05 NOTE — ED Provider Notes (Signed)
[facility] EMERGENCY DEPARTMENT Provider Note   CSN: [id number] Arrival date & time: 03/05/19  1758     History Chief Complaint  Patient presents with   Hypertension    Gary Guzman is a 64 y.o. male with history of CKD, CAD status post drug-eluting stent placement, hypertension, hyperlipidemia, arthritis presents today for evaluation of acute onset, persistent hypertension for 2 days.  He reports that his blood pressures are typically well controlled in the 0000000 systolic.  He had a follow-up appointment with his cardiologist on 02/22/2019 and noted he had a few periods of lightheadedness and was hypotensive so he was told to have his carvedilol dose.  He states within a few days his blood pressures had increased so he went back to his normal dosage.  Yesterday morning he awoke with a severe frontal headache which he states improves when his blood pressure improves.  It is dull and throbbing and he typically has this headache when his blood pressure is elevated.  At the time when he awoke yesterday he states his blood pressure was elevated at 244/117.  He denies any vision changes, numbness or weakness of the extremities.  He does report feeling generally weak and fatigued.  He has had intermittent sharp substernal chest pains radiating to the right side for the last 2 days.  He did feel a little short of breath upon awakening yesterday but this has since resolved.  He denies any nausea, vomiting, fevers, cough, abdominal pain, diarrhea or constipation.  He is a former smoker, quit 20 years ago, denies recreational drug use or alcohol use.  He called his cardiologist today who recommended presentation to a [facility] ER so he went to [facility] where he states "they just kept me for a few hours and then discharged me with a blood pressure of 188/90".  He is quite concerned that his blood pressure remains elevated although his symptoms overall have improved. Paperwork at the bedside shows reassuring CBC  and BMP and negative troponin.  He has been taking extra doses of his carvedilol with some improvement.  He does note bilateral lower extremity edema that he states is a little worse than baseline.  The history is provided by the patient.       Past Medical History:  Diagnosis Date   Arthritis    Chronic kidney disease    Coronary artery disease    Depression    Dizziness    Heart disease    High cholesterol    Hypertension    Kidney stones    Polyp of colon     Patient Active Problem List   Diagnosis Date Noted   Passed out 06/10/2015   Peripheral neuropathy 06/10/2015   Chronic midline low back pain 11/04/2010    Past Surgical History:  Procedure Laterality Date   CARDIAC CATHETERIZATION     COLONOSCOPY WITH PROPOFOL N/A 05/09/2015   Procedure: COLONOSCOPY WITH PROPOFOL;  Surgeon: Rogene Houston, MD;  Location: [facility];  Service: Endoscopy;  Laterality: N/A;  10:30 - Ann to notify pt to arrive at [place]  10/04/13   kidney stones         Family History  Problem Relation Age of Onset   Emphysema Father    Alzheimer's disease Mother    Hypertension Mother    Hyperlipidemia Mother     Social History   Tobacco Use   Smoking status: Former Smoker   Smokeless tobacco: Never Used  Tobacco comment: Quit 12 years ago.  Substance Use Topics   Alcohol use: No    Alcohol/week: 0.0 standard drinks    Comment: quit 12 yrs ago   Drug use: No    Comment: quit 12 yrs ago    Home Medications Prior to Admission medications   Medication Sig Start Date End Date Taking? Authorizing Provider  Apple Cider Vinegar 500 MG TABS Take 1 tablet by mouth 2 (two) times daily.    [provider]  aspirin 81 MG tablet Take 1 tablet (81 mg total) by mouth daily. 05/10/15   Rehman, Mechele Dawley, MD  citalopram (CELEXA) 40 MG tablet Take 40 mg by mouth daily.    [provider]  clonazePAM (KLONOPIN) 1 MG tablet Take  1 mg by mouth 2 (two) times daily.    [provider]  gabapentin (NEURONTIN) 600 MG tablet Take 600 mg by mouth 2 (two) times daily. 600 in am and 1200 at night    [provider]  metoprolol tartrate (LOPRESSOR) 25 MG tablet Take 25 mg by mouth 2 (two) times daily.    [provider]  nortriptyline (PAMELOR) 50 MG capsule Take 50 mg by mouth at bedtime.    [provider]  prasugrel (EFFIENT) 10 MG TABS tablet Take 1 tablet (10 mg total) by mouth daily. 05/10/15   Rehman, Mechele Dawley, MD  rosuvastatin (CRESTOR) 40 MG tablet Take 40 mg by mouth daily.    [provider]  tamsulosin (FLOMAX) 0.4 MG CAPS capsule Take 1 capsule by mouth at bedtime as needed. 12/16/15   [provider]    Allergies    Atorvastatin and Codeine  Review of Systems   Review of Systems  Constitutional: Negative for chills and fever.  Eyes: Negative for photophobia and visual disturbance.  Respiratory: Positive for shortness of breath.   Cardiovascular: Positive for chest pain and leg swelling.  Gastrointestinal: Negative for abdominal pain, diarrhea, nausea and vomiting.  Neurological: Positive for headaches. Negative for weakness, light-headedness and numbness.  All other systems reviewed and are negative.   Physical Exam Updated Vital Signs BP (!) 188/68    Pulse 73    Temp 98.1 F (36.7 C)    Resp 13    Ht 6' (1.829 m)    Wt 120.2 kg    SpO2 93%    BMI 35.94 kg/m   Physical Exam Vitals and nursing note reviewed.  Constitutional:      General: He is not in acute distress.    Appearance: He is well-developed.  HENT:     Head: Normocephalic and atraumatic.  Eyes:     General:        Right eye: No discharge.        Left eye: No discharge.     Conjunctiva/sclera: Conjunctivae normal.  Neck:     Vascular: No JVD.     Trachea: No tracheal deviation.  Cardiovascular:     Rate and Rhythm: Normal rate and regular rhythm.     Heart sounds: Normal heart  sounds.     Comments: 2+ radial pulses bilaterally.  1+ DP/PT pulses bilaterally which patient reports is at baseline and for which he is currently being evaluated by his cardiologist and PCP.  1+ pitting edema of the bilateral lower extremities.  Bevelyn Buckles' sign absent bilaterally.  Compartments are soft. Pulmonary:     Effort: Pulmonary effort is normal.     Breath sounds: Normal breath sounds.  Abdominal:  General: Bowel sounds are normal. There is no distension.     Palpations: Abdomen is soft.     Tenderness: There is no abdominal tenderness. There is no guarding or rebound.  Musculoskeletal:     Cervical back: Normal range of motion and neck supple.  Skin:    General: Skin is warm and dry.     Capillary Refill: Capillary refill takes less than 2 seconds.     Findings: No erythema.  Neurological:     Mental Status: He is alert.     Comments: Fluent speech with no evidence of dysarthria or aphasia.  No facial droop.  Cranial nerves appear grossly intact.  Moves all extremities spontaneously without difficulty.  Psychiatric:        Behavior: Behavior normal.     ED Results / Procedures / Treatments   Labs (all labs ordered are listed, but only abnormal results are displayed) Labs Reviewed  CBC WITH DIFFERENTIAL/PLATELET - Abnormal; Notable for the following components:      Result Value   Platelets 144 (*)    All other components within normal limits  BASIC METABOLIC PANEL - Abnormal; Notable for the following components:   Glucose, Bld 125 (*)    Calcium 8.8 (*)    All other components within normal limits  BRAIN NATRIURETIC PEPTIDE - Abnormal; Notable for the following components:   B Natriuretic Peptide 123.0 (*)    All other components within normal limits  URINALYSIS, ROUTINE W REFLEX MICROSCOPIC - Abnormal; Notable for the following components:   Hgb urine dipstick SMALL (*)    Protein, ur 30 (*)    All other components within normal limits  TROPONIN I (HIGH  SENSITIVITY)  TROPONIN I (HIGH SENSITIVITY)    EKG EKG Interpretation  Date/Time:  Monday March 05 2019 18:41:55 EST Ventricular Rate:  75 PR Interval:  160 QRS Duration: 96 QT Interval:  428 QTC Calculation: 477 R Axis:   50 Text Interpretation: Normal sinus rhythm Possible Left atrial enlargement Nonspecific ST and T wave abnormality Abnormal ECG Artifact Confirmed by Fredia Sorrow [phone]) on 03/05/2019 6:50:43 PM   Radiology CT Head Wo Contrast  Result Date: 03/05/2019 CLINICAL DATA:  Headache. Hypertension. EXAM: CT HEAD WITHOUT CONTRAST TECHNIQUE: Contiguous axial images were obtained from the base of the skull through the vertex without intravenous contrast. COMPARISON:  12/28/2015 FINDINGS: Brain: There is no evidence of acute infarct, intracranial hemorrhage, mass, midline shift, or extra-axial fluid collection. The ventricles and sulci are within normal limits for age. Hypodensities in the cerebral white matter bilaterally are unchanged and nonspecific but compatible with mild chronic small vessel ischemic disease. Vascular: Calcified atherosclerosis at the skull base. No hyperdense vessel. Skull: No fracture or suspicious osseous lesion. Sinuses/Orbits: Partially visualized mild mucosal thickening in the right maxillary sinus. Minimal chronic air cell opacification at the right mastoid tip. Unremarkable orbits. Other: None. IMPRESSION: 1. No evidence of acute intracranial abnormality. 2. Mild chronic small vessel ischemic disease. Electronically Signed   By: Logan Bores M.D.   On: 03/05/2019 20:38    Procedures Procedures (including critical care time)  Medications Ordered in ED Medications  furosemide (LASIX) injection 40 mg (has no administration in time range)  diphenhydrAMINE (BENADRYL) injection 12.5 mg (12.5 mg Intravenous Given 03/05/19 2050)  prochlorperazine (COMPAZINE) injection 10 mg (10 mg Intravenous Given 03/05/19 2050)    ED Course  I have reviewed the  triage vital signs and the nursing notes.  Pertinent labs & imaging results that were  available during my care of the patient were reviewed by me and considered in my medical decision making (see chart for details).    MDM Rules/Calculators/A&P                      Patient presenting for evaluation of elevated blood pressure with associated frontal headache, intermittent atypical sounding chest pains for the last 2 days.  He is persistently hypertensive in the ED.  No focal neurologic deficits noted on neuro examination.  A head CT was obtained which shows no acute intracranial abnormalities.  Doubt CVA.  No fever or meningeal signs noted.  He was given IV Compazine and Benadryl in the ED with improvement in his headache and reports feeling better on reevaluation.  With regards to his chest pain it sounds atypical for cardiac disease.  His EKG shows normal sinus rhythm, findings consistent with hypertension but no acute ischemic abnormalities.  Initial troponin is 10, will require second troponin to trend due to risk factors.  Remainder of labs reviewed by me show no leukocytosis, no anemia, no metabolic derangements, no renal insufficiency.  His BNP is mildly elevated, in the setting of mild peripheral edema we will give him a dose of IV Lasix.  His UA shows mild proteinuria, not unusual in the setting of hypertension.  On reevaluation he is resting more comfortably reports he is feeling better.  His blood pressures have been elevated in the ED still up to the A999333 systolic then back down to the A999333 systolic.  His diastolics have been fairly well controlled.  We are still pending results of chest x-ray and second troponin.  He has a cardiologist at [facility] with whom he follows regularly but is requesting to see a cardiologist in the [facility] system.  I will arrange for the referral but encouraged him to continue regular follow-up with his current cardiologist in the meantime as they are planning for a stress  test and for evaluation of his lower extremity edema on an outpatient basis.  I have a low suspicion of DVT today.  Also doubt PE in the absence of hypoxia, tachycardia or tachypnea.  He has no complaint of significant shortness of breath.  His current cardiologist would probably be the best provider to make any medication adjustments or other recommendations regarding his blood pressure.   9:55 PM Signed out to oncoming provider PA [place].  Currently pending results of second troponin and chest x-ray.  At this time no evidence of hypertensive emergency or endorgan damage.  If troponin is negative and chest x-ray is reassuring he will likely be stable for discharge home with close follow-up with cardiology on an outpatient basis.  I expressed to patient that he should continue following closely with his current cardiologist while awaiting to be seen by our cardiologists so that they can adjust his medications if indicated. Patient seen and evaluated by Dr. Rogene Houston who agrees with assessment and plan at this time.  Final Clinical Impression(s) / ED Diagnoses Final diagnoses:  Hypertension, unspecified type  Atypical chest pain  Frontal headache    Rx / DC Orders ED Discharge Orders    None       Renita Papa, PA-C 03/05/19 2200    Fredia Sorrow, MD 03/06/19 1528

## 2019-03-05 NOTE — ED Provider Notes (Signed)
Medical screening examination/treatment/procedure(s) were conducted as a shared visit with non-physician practitioner(s) and myself.  I personally evaluated the patient during the encounter.  EKG Interpretation  Date/Time:  Monday March 05 2019 18:41:55 EST Ventricular Rate:  75 PR Interval:  160 QRS Duration: 96 QT Interval:  428 QTC Calculation: 477 R Axis:   50 Text Interpretation: Normal sinus rhythm Possible Left atrial enlargement Nonspecific ST and T wave abnormality Abnormal ECG Artifact Confirmed by Fredia Sorrow [phone]) on 03/05/2019 6:50:43 PM   Patient with a known history of coronary artery disease.  Peripheral vascular disease.  Followed by cardiology at [facility].  Went to [facility] today due to headache and elevated blood pressure.  Also had chest discomfort.  Patient is being evaluated peripheral vascular disease wise and also has an upcoming stress test planned.  Patient worried about his blood pressures been high.  They have been high for 2 days.  Few days ago his blood pressure was low and his cardiology cut his meds back in half.  Patient is now back on his normal amount of doses.  Still having high systolic blood pressures diastolics are fine.  Patient in no acute distress.  Headache improved head CT negative labs without significant abnormalities.  BNP elevated some 123.  Patient was going to be given 40 of Lasix.  Initial troponin was 10 delta troponin is pending if that is normal will have patient contact his cardiologist tomorrow and they may make some recommendations on blood pressure adjustment or may be the one a few more days to see what the trend is.  Also will give patient referral to cardiology here locally as per his request.  Radiology studies head CT negative for any acute abnormalities.   Fredia Sorrow, MD 03/05/19 2201

## 2019-03-05 NOTE — Discharge Instructions (Addendum)
Your work-up today was reassuring with no evidence of stroke or heart attack.  Your kidney function is normal.  Please call your cardiologist tomorrow for further recommendations and close follow-up.  They may want to adjust your medications.  I would continue with the stress test and other studies that they have ordered outpatient.  I did put in a referral for her cardiologist but it will take some time to be set up with them so it makes the most sense for you to continue seeing your cardiologist and going off of the recommendations in the meantime.  Continue to take all of your home medications as prescribed.  Avoid salty foods.  Return to the emergency department if any concerning signs or symptoms develop such as severe headaches, persistent vomiting, high fevers or neck stiffness, loss of consciousness, severe chest pains, or shortness of breath.

## 2019-03-05 NOTE — ED Triage Notes (Signed)
States his blood pressure is going up, recently had a change in his dose, states he went to [facility]  Today for same. States he is having circulation problems in both feet.

## 2019-03-05 NOTE — ED Provider Notes (Signed)
Care assumed from [facility], PA-C at shift change with delta trop and CXR pending.   In brief, this patient is a 64 year old male past medical 3 CKD, CAD, stent placement, hypertension, hyperlipidemia who presents for evaluation of hypertension that is been ongoing for last 2 days.  He states that normally his blood pressure is well controlled within the 140s.  He reports over the last couple days, he has noted to be higher, in the 180s.  He has taken extra doses of his carvedilol.  Patient states that he was seen at [facility] today for evaluation.  At that time, they did work-up and stated that he was discharged with his blood pressure of 188/90.  He is concerned that his blood pressure remains elevated.  He also reports he has had some right-sided chest pain that is radiating to the right side for last 2 days.  He has not had any nausea/vomiting, fevers.  Please see note from previous provider for full history/physical exam.  Physical Exam  BP (!) 200/79   Pulse 67   Temp 98.1 F (36.7 C)   Resp 17   Ht 6' (1.829 m)   Wt 120.2 kg   SpO2 94%   BMI 35.94 kg/m   Physical Exam  ED Course/Procedures     Procedures   Results for orders placed or performed during the hospital encounter of 03/05/19 (from the past 24 hour(s))  Urinalysis, Routine w reflex microscopic     Status: Abnormal   Collection Time: 03/05/19  7:41 PM  Result Value Ref Range   Color, Urine YELLOW YELLOW   APPearance CLEAR CLEAR   Specific Gravity, Urine 1.016 1.005 - 1.030   pH 6.0 5.0 - 8.0   Glucose, UA NEGATIVE NEGATIVE mg/dL   Hgb urine dipstick SMALL (A) NEGATIVE   Bilirubin Urine NEGATIVE NEGATIVE   Ketones, ur NEGATIVE NEGATIVE mg/dL   Protein, ur 30 (A) NEGATIVE mg/dL   Nitrite NEGATIVE NEGATIVE   Leukocytes,Ua NEGATIVE NEGATIVE   RBC / HPF 6-10 0 - 5 RBC/hpf   WBC, UA 6-10 0 - 5 WBC/hpf   Bacteria, UA NONE SEEN NONE SEEN   Mucus PRESENT   CBC with Differential     Status: Abnormal   Collection  Time: 03/05/19  8:18 PM  Result Value Ref Range   WBC 4.4 4.0 - 10.5 K/uL   RBC 4.71 4.22 - 5.81 MIL/uL   Hemoglobin 13.8 13.0 - 17.0 g/dL   HCT 43.2 39.0 - 52.0 %   MCV 91.7 80.0 - 100.0 fL   MCH 29.3 26.0 - 34.0 pg   MCHC 31.9 30.0 - 36.0 g/dL   RDW 13.8 11.5 - 15.5 %   Platelets 144 (L) 150 - 400 K/uL   nRBC 0.0 0.0 - 0.2 %   Neutrophils Relative % 63 %   Neutro Abs 2.7 1.7 - 7.7 K/uL   Lymphocytes Relative 27 %   Lymphs Abs 1.2 0.7 - 4.0 K/uL   Monocytes Relative 8 %   Monocytes Absolute 0.4 0.1 - 1.0 K/uL   Eosinophils Relative 2 %   Eosinophils Absolute 0.1 0.0 - 0.5 K/uL   Basophils Relative 0 %   Basophils Absolute 0.0 0.0 - 0.1 K/uL   Immature Granulocytes 0 %   Abs Immature Granulocytes 0.01 0.00 - 0.07 K/uL  Basic metabolic panel     Status: Abnormal   Collection Time: 03/05/19  8:18 PM  Result Value Ref Range   Sodium 143 135 - 145  mmol/L   Potassium 3.5 3.5 - 5.1 mmol/L   Chloride 104 98 - 111 mmol/L   CO2 28 22 - 32 mmol/L   Glucose, Bld 125 (H) 70 - 99 mg/dL   BUN 14 8 - 23 mg/dL   Creatinine, Ser 1.18 0.61 - 1.24 mg/dL   Calcium 8.8 (L) 8.9 - 10.3 mg/dL   GFR calc non Af Amer >60 >60 mL/min   GFR calc Af Amer >60 >60 mL/min   Anion gap 11 5 - 15  Brain natriuretic peptide     Status: Abnormal   Collection Time: 03/05/19  8:18 PM  Result Value Ref Range   B Natriuretic Peptide 123.0 (H) 0.0 - 100.0 pg/mL  Troponin I (High Sensitivity)     Status: None   Collection Time: 03/05/19  8:18 PM  Result Value Ref Range   Troponin I (High Sensitivity) 10 <18 ng/L  Troponin I (High Sensitivity)     Status: None   Collection Time: 03/05/19  9:51 PM  Result Value Ref Range   Troponin I (High Sensitivity) 12 <18 ng/L    MDM     PLAN: Plan for second troponin, chest x-ray.  We will plan to give him outpatient [facility] cardiology follow-up, as he was interested in following up with them, though he does have his own cardiology office that he follows  with.  MDM:  Delta troponin is unremarkable.  Chest x-ray shows some cardiomegaly but no acute process.  Otherwise unremarkable.  Discussed results with patient.  He is checking his blood pressure with his own monitoring in the bed is reading with systolic blood pressure in the 170s.  Patient denies any symptoms at this time and states that he would like to go home.  I discussed his work-up with him.  He states he is good to call his cardiologist tomorrow and he is scheduled to follow-up with them.  Patient states that he would like to go home now.  Patient is well-appearing with no signs of acute distress. At this time, patient exhibits no emergent life-threatening condition that require further evaluation in ED or admission. Patient had ample opportunity for questions and discussion. All patient's questions were answered with full understanding. Strict return precautions discussed. Patient expresses understanding and agreement to plan.   1. Hypertension, unspecified type   2. Atypical chest pain   3. Frontal headache     Portions of this note were generated with Dragon dictation software. Dictation errors may occur despite best attempts at proofreading.    Volanda Napoleon, PA-C 03/05/19 2259    Fredia Sorrow, MD 03/06/19 [phone]

## 2020-03-21 ENCOUNTER — Encounter (INDEPENDENT_AMBULATORY_CARE_PROVIDER_SITE_OTHER): Payer: Self-pay | Admitting: *Deleted

## 2020-04-27 ENCOUNTER — Encounter (HOSPITAL_COMMUNITY): Payer: Self-pay

## 2020-04-27 ENCOUNTER — Emergency Department (HOSPITAL_COMMUNITY)
Admission: EM | Admit: 2020-04-27 | Discharge: 2020-04-27 | Disposition: A | Discharge status: Home / Self Care | Payer: Medicare HMO | Attending: Emergency Medicine | Admitting: Emergency Medicine

## 2020-04-27 ENCOUNTER — Emergency Department (HOSPITAL_COMMUNITY): Payer: Medicare HMO

## 2020-04-27 ENCOUNTER — Other Ambulatory Visit: Payer: Self-pay

## 2020-04-27 DIAGNOSIS — Z79899 Other long term (current) drug therapy: Secondary | ICD-10-CM | POA: Diagnosis not present

## 2020-04-27 DIAGNOSIS — Z7902 Long term (current) use of antithrombotics/antiplatelets: Secondary | ICD-10-CM | POA: Insufficient documentation

## 2020-04-27 DIAGNOSIS — I2510 Atherosclerotic heart disease of native coronary artery without angina pectoris: Secondary | ICD-10-CM | POA: Diagnosis not present

## 2020-04-27 DIAGNOSIS — Z951 Presence of aortocoronary bypass graft: Secondary | ICD-10-CM | POA: Diagnosis not present

## 2020-04-27 DIAGNOSIS — I129 Hypertensive chronic kidney disease with stage 1 through stage 4 chronic kidney disease, or unspecified chronic kidney disease: Secondary | ICD-10-CM | POA: Insufficient documentation

## 2020-04-27 DIAGNOSIS — R0602 Shortness of breath: Secondary | ICD-10-CM | POA: Diagnosis present

## 2020-04-27 DIAGNOSIS — Z20822 Contact with and (suspected) exposure to covid-19: Secondary | ICD-10-CM | POA: Diagnosis not present

## 2020-04-27 DIAGNOSIS — R9389 Abnormal findings on diagnostic imaging of other specified body structures: Secondary | ICD-10-CM

## 2020-04-27 DIAGNOSIS — Z87891 Personal history of nicotine dependence: Secondary | ICD-10-CM | POA: Insufficient documentation

## 2020-04-27 DIAGNOSIS — R918 Other nonspecific abnormal finding of lung field: Secondary | ICD-10-CM | POA: Diagnosis not present

## 2020-04-27 DIAGNOSIS — N189 Chronic kidney disease, unspecified: Secondary | ICD-10-CM | POA: Insufficient documentation

## 2020-04-27 DIAGNOSIS — Z7982 Long term (current) use of aspirin: Secondary | ICD-10-CM | POA: Insufficient documentation

## 2020-04-27 LAB — CBC WITH DIFFERENTIAL/PLATELET
Abs Immature Granulocytes: 0.02 10*3/uL (ref 0.00–0.07)
Basophils Absolute: 0 10*3/uL (ref 0.0–0.1)
Basophils Relative: 0 %
Eosinophils Absolute: 0.2 10*3/uL (ref 0.0–0.5)
Eosinophils Relative: 4 %
HCT: 42.9 % (ref 39.0–52.0)
Hemoglobin: 14.3 g/dL (ref 13.0–17.0)
Immature Granulocytes: 0 %
Lymphocytes Relative: 21 %
Lymphs Abs: 1.2 10*3/uL (ref 0.7–4.0)
MCH: 29.6 pg (ref 26.0–34.0)
MCHC: 33.3 g/dL (ref 30.0–36.0)
MCV: 88.8 fL (ref 80.0–100.0)
Monocytes Absolute: 0.4 10*3/uL (ref 0.1–1.0)
Monocytes Relative: 6 %
Neutro Abs: 4 10*3/uL (ref 1.7–7.7)
Neutrophils Relative %: 69 %
Platelets: 151 10*3/uL (ref 150–400)
RBC: 4.83 MIL/uL (ref 4.22–5.81)
RDW: 13.4 % (ref 11.5–15.5)
WBC: 5.9 10*3/uL (ref 4.0–10.5)
nRBC: 0 % (ref 0.0–0.2)

## 2020-04-27 LAB — COMPREHENSIVE METABOLIC PANEL
ALT: 27 U/L (ref 0–44)
AST: 29 U/L (ref 15–41)
Albumin: 4.2 g/dL (ref 3.5–5.0)
Alkaline Phosphatase: 67 U/L (ref 38–126)
Anion gap: 9 (ref 5–15)
BUN: 17 mg/dL (ref 8–23)
CO2: 25 mmol/L (ref 22–32)
Calcium: 9.3 mg/dL (ref 8.9–10.3)
Chloride: 106 mmol/L (ref 98–111)
Creatinine, Ser: 1.19 mg/dL (ref 0.61–1.24)
GFR, Estimated: >60 mL/min (ref >60)
Glucose, Bld: 118 mg/dL — ABNORMAL HIGH (ref 70–99)
Potassium: 4.1 mmol/L (ref 3.5–5.1)
Sodium: 140 mmol/L (ref 135–145)
Total Bilirubin: 0.7 mg/dL (ref 0.3–1.2)
Total Protein: 7.2 g/dL (ref 6.5–8.1)

## 2020-04-27 LAB — RESP PANEL BY RT-PCR (FLU A&B, COVID) ARPGX2
Influenza A by PCR: NEGATIVE
Influenza B by PCR: NEGATIVE
SARS Coronavirus 2 by RT PCR: NEGATIVE

## 2020-04-27 LAB — TROPONIN I (HIGH SENSITIVITY): Troponin I (High Sensitivity): 11 ng/L (ref <18)

## 2020-04-27 LAB — BRAIN NATRIURETIC PEPTIDE: B Natriuretic Peptide: 76 pg/mL (ref 0.0–100.0)

## 2020-04-27 MED ORDER — AEROCHAMBER PLUS FLO-VU MEDIUM MISC
1.0000 | Freq: Once | Status: AC
  Administered 2020-04-27: 1
  Filled 2020-04-27: qty 1

## 2020-04-27 MED ORDER — ALBUTEROL SULFATE HFA 108 (90 BASE) MCG/ACT IN AERS
2.0000 | INHALATION_SPRAY | Freq: Once | RESPIRATORY_TRACT | Status: AC
  Administered 2020-04-27: 2 via RESPIRATORY_TRACT
  Filled 2020-04-27: qty 6.7

## 2020-04-27 NOTE — ED Provider Notes (Signed)
[facility] EMERGENCY DEPARTMENT Provider Note   CSN: [id number] Arrival date & time: 04/27/20  1454     History Chief Complaint  Patient presents with  . Shortness of Breath    Gary Guzman is a 65 y.o. male.  HPI   This patient is a 65 year old male with a history of coronary disease status post multiple stents many years ago, history of hypertension, history of chronic pain for which she has been on chronic opiate therapy.  He reports that recently his doctor tried to get him off of opiates and placed him on buprenorphine which she has been taking every day, he notes that for about the last month he has had some increasing shortness of breath, this has been mild but over the last couple of days seems to have increased in its intensity, it is at rest, it is with exertion, it is almost all the time, it does not limit what he can do but because he was feeling worse today he called 911.  The paramedics reported that he was a little bit gray when he was trying to ambulate though the patient states that he has no chest pain or increasing shortness of breath with ambulation and his activity has not been limited.  They noted his blood pressure to be just over 166 systolic but this is gradually improved in route to the hospital.  The patient is compliant with taking his medications including CPAP, aspirin 81 mg and Plavix.  He has not recently seen his heart doctor, he does follow closely with his family doctor recently separated with pain clinics and has recently started on buprenorphine which she has been on for at least 3 weeks.  He denies significant gastrointestinal symptoms, he has no chest pain heaviness or tightness, he has no fevers or chills, he has no significant cough and reports that he last smoked cigarettes about 12 years ago, he does not carry a diagnosis of COPD.  Nobody has been sick around him, he has not had sneezing or signs of allergies  Past Medical History:  Diagnosis Date   . Arthritis   . Chronic kidney disease   . Coronary artery disease   . Depression   . Dizziness   . Heart disease   . High cholesterol   . Hypertension   . Kidney stones   . Polyp of colon     Patient Active Problem List   Diagnosis Date Noted  . Passed out 06/10/2015  . Peripheral neuropathy 06/10/2015  . Chronic midline low back pain 11/04/2010    Past Surgical History:  Procedure Laterality Date  . CARDIAC CATHETERIZATION    . COLONOSCOPY WITH PROPOFOL N/A 05/09/2015   Procedure: COLONOSCOPY WITH PROPOFOL;  Surgeon: Rogene Houston, MD;  Location: [facility];  Service: Endoscopy;  Laterality: N/A;  10:30 - Ann to notify pt to arrive at 7:45  . CORONARY STENT PLACEMENT  10/04/13  . kidney stones         Family History  Problem Relation Age of Onset  . Emphysema Father   . Alzheimer's disease Mother   . Hypertension Mother   . Hyperlipidemia Mother     Social History   Tobacco Use  . Smoking status: Former Research scientist (life sciences)  . Smokeless tobacco: Never Used  . Tobacco comment: Quit 12 years ago.  Substance Use Topics  . Alcohol use: No    Alcohol/week: 0.0 standard drinks    Comment: quit 12 yrs ago  . Drug  use: No    Comment: quit 12 yrs ago    Home Medications Prior to Admission medications   Medication Sig Start Date End Date Taking? Authorizing Provider  amLODipine (NORVASC) 5 MG tablet Take 5 mg by mouth daily. 03/27/20  Yes [provider]  aspirin 81 MG tablet Take 1 tablet (81 mg total) by mouth daily. 05/10/15  Yes Rehman, Mechele Dawley, MD  buprenorphine (SUBUTEX) 2 MG SUBL SL tablet Place 2 mg under the tongue daily as needed. 04/07/20  Yes [provider]  carvedilol (COREG) 25 MG tablet Take 25 mg by mouth 2 (two) times daily with a meal. 02/22/19  Yes [provider]  Cholecalciferol (VITAMIN D-3) 25 MCG (1000 UT) CAPS Take 1 capsule by mouth daily. 12/24/19  Yes [provider]  citalopram (CELEXA) 40 MG tablet Take 40 mg by  mouth daily.   Yes [provider]  clopidogrel (PLAVIX) 75 MG tablet Take 75 mg by mouth daily. 02/22/19  Yes [provider]  DULoxetine (CYMBALTA) 30 MG capsule Take 30 mg by mouth daily.   Yes [provider]  finasteride (PROSCAR) 5 MG tablet Take 5 mg by mouth daily. 12/02/16  Yes [provider]  gabapentin (NEURONTIN) 600 MG tablet Take 600 mg by mouth 2 (two) times daily. 600 in am and 1200 at night   Yes [provider]  lisinopril (ZESTRIL) 20 MG tablet Take 20 mg by mouth 2 (two) times daily. 07/23/19  Yes [provider]  metFORMIN (GLUCOPHAGE) 500 MG tablet Take 500 mg by mouth 2 (two) times daily with a meal.   Yes [provider]  metoprolol tartrate (LOPRESSOR) 25 MG tablet Take 25 mg by mouth 2 (two) times daily.   Yes [provider]  nortriptyline (PAMELOR) 50 MG capsule Take 50 mg by mouth at bedtime.   Yes [provider]  rosuvastatin (CRESTOR) 40 MG tablet Take 40 mg by mouth daily.   Yes [provider]  tamsulosin (FLOMAX) 0.4 MG CAPS capsule Take 1 capsule by mouth at bedtime as needed. 12/16/15  Yes [provider]  vitamin B-12 (CYANOCOBALAMIN) 500 MCG tablet Take 500 mcg by mouth daily.   Yes [provider]  Apple Cider Vinegar 500 MG TABS Take 1 tablet by mouth 2 (two) times daily. Patient not taking: Reported on 04/27/2020    [provider]  clonazePAM (KLONOPIN) 1 MG tablet Take 1 mg by mouth 2 (two) times daily. Patient not taking: Reported on 04/27/2020    [provider]  prasugrel (EFFIENT) 10 MG TABS tablet Take 1 tablet (10 mg total) by mouth daily. Patient not taking: Reported on 04/27/2020 05/10/15   Rogene Houston, MD    Allergies    Atorvastatin and Codeine  Review of Systems   Review of Systems  All other systems reviewed and are negative.   Physical Exam Updated Vital Signs BP (!) 154/105   Pulse 64   Temp 98.1 F (36.7  C) (Oral)   Resp 10   Ht 1.829 m (6')   Wt 114.3 kg   SpO2 100%   BMI 34.18 kg/m   Physical Exam Vitals and nursing note reviewed.  Constitutional:      General: He is not in acute distress.    Appearance: He is well-developed.  HENT:     Head: Normocephalic and atraumatic.     Mouth/Throat:     Pharynx: No oropharyngeal exudate.  Eyes:  General: No scleral icterus.       Right eye: No discharge.        Left eye: No discharge.     Conjunctiva/sclera: Conjunctivae normal.     Pupils: Pupils are equal, round, and reactive to light.  Neck:     Thyroid: No thyromegaly.     Vascular: No JVD.  Cardiovascular:     Rate and Rhythm: Normal rate and regular rhythm.     Heart sounds: Normal heart sounds. No murmur heard. No friction rub. No gallop.   Pulmonary:     Effort: Pulmonary effort is normal. No respiratory distress.     Breath sounds: Normal breath sounds. No wheezing or rales.  Abdominal:     General: Bowel sounds are normal. There is no distension.     Palpations: Abdomen is soft. There is no mass.     Tenderness: There is no abdominal tenderness.  Musculoskeletal:        General: No tenderness. Normal range of motion.     Cervical back: Normal range of motion and neck supple.  Lymphadenopathy:     Cervical: No cervical adenopathy.  Skin:    General: Skin is warm and dry.     Findings: No erythema or rash.  Neurological:     Mental Status: He is alert.     Coordination: Coordination normal.  Psychiatric:        Behavior: Behavior normal.     ED Results / Procedures / Treatments   Labs (all labs ordered are listed, but only abnormal results are displayed) Labs Reviewed  COMPREHENSIVE METABOLIC PANEL - Abnormal; Notable for the following components:      Result Value   Glucose, Bld 118 (*)    All other components within normal limits  RESP PANEL BY RT-PCR (FLU A&B, COVID) ARPGX2  CBC WITH DIFFERENTIAL/PLATELET  BRAIN NATRIURETIC PEPTIDE  TROPONIN I  (HIGH SENSITIVITY)    EKG EKG Interpretation  Date/Time:  Sunday April 27 2020 15:01:28 EDT Ventricular Rate:  61 PR Interval:    QRS Duration: 114 QT Interval:  450 QTC Calculation: 454 R Axis:   82 Text Interpretation: Sinus rhythm Borderline intraventricular conduction delay Borderline T abnormalities, lateral leads since last tracing no significant change Confirmed by Noemi Chapel [phone]) on 04/27/2020 3:09:24 PM   Radiology DG Chest Port 1 View  Result Date: 04/27/2020 CLINICAL DATA:  Shortness of breath this morning. EXAM: PORTABLE CHEST 1 VIEW COMPARISON:  March 05, 2019 FINDINGS: The heart size and mediastinal contours are stable. The heart size is enlarged. There is a 9 mm nodular opacity of the right lung base. There is no focal infiltrate, pulmonary edema, or pleural effusion. The visualized skeletal structures are unremarkable. IMPRESSION: No acute cardiopulmonary disease identified. 9 mm nodular opacity of the right lung base. Frontal chest rate with nipple marker in place is suggested. Electronically Signed   By: Abelardo Diesel M.D.   On: 04/27/2020 16:18    Procedures Procedures   Medications Ordered in ED Medications  albuterol (VENTOLIN HFA) 108 (90 Base) MCG/ACT inhaler 2 puff (2 puffs Inhalation Given 04/27/20 1558)  AeroChamber Plus Flo-Vu Medium MISC 1 each (1 each Other Given 04/27/20 1559)    ED Course  I have reviewed the triage vital signs and the nursing notes.  Pertinent labs & imaging results that were available during my care of the patient were reviewed by me and considered in my medical decision making (see chart for details).  Clinical Course as  of 04/27/20 1651  Sun Apr 27, 2020  1637 I have reviewed the patient's labs, his chest x-ray is unremarkable, he will need a repeat film at some point to further evaluate given the abnormal nodular-like opacity which could be a nipple shadow but could be something internal.  Blood counts metabolic panel and  troponin are all unremarkable and normal, EKG is nonischemic, patient is again stable at this time and his oxygen on ambulation never dropped below 97%.  He has been given an albuterol inhaler with a AeroChamber and instructed on its use, feeling better at this time [BM]    Clinical Course User Index [BM] Noemi Chapel, MD   MDM Rules/Calculators/A&P                          This patient has no edema of the legs, clear lungs without wheezing, speaks in full sentences without distress and has normal heart sounds and a normal EKG which is nonischemic and without any arrhythmia.  At this time I think it is reasonable to further evaluate for the cause of the patient's shortness of breath.  He is low risk for pulmonary embolism as he has not had any surgery, swelling of the legs, recent travel or immobilization and no history of cancer.  No history of thromboembolism and is currently taking his antiplatelet agents.  He is increased risk for coronary disease though his symptoms do not fit an exertional pattern and he has no objective evidence clinically or on EKG.  We will obtain a troponin, labs and a chest x-ray, he may benefit from some albuterol to benefit his shortness of breath.  Patient is agreeable to the plan, this could be related to the buprenorphine but the patient does endorse that this actually started before the buprenorphine and only got worse afterwards   Labs normal  Reassuring exam  Ambulatory without difficulty  Trop neg - CXR and EKG reassuring  Covid neg  CXR with ? noduule - pt made aware  Pt stable for d/c.  Final Clinical Impression(s) / ED Diagnoses Final diagnoses:  Shortness of breath  Abnormal chest x-ray      Noemi Chapel, MD 04/27/20 1651

## 2020-04-27 NOTE — ED Notes (Signed)
Pt ambulatory in room, pt remains 97% on RA, NAD noted.

## 2020-04-27 NOTE — ED Notes (Signed)
Pt ambulated, O2 stayed between 95%-98%

## 2020-04-27 NOTE — ED Triage Notes (Signed)
Pt presents to ED via [facility]. Pt started with SOB this am at 0500, trouble catching his breath. Pt started on buprenorphine recently and states he thinks it made it worse.

## 2020-04-27 NOTE — Discharge Instructions (Signed)
Use the albuterol inhaler, 2 puffs every 4 hours as needed for shortness of breath Follow-up with your cardiologist in [place] within the next 3 or 4 days Please let your family doctor follow-up all of your results including the chest x-ray, the chest x-ray needs to be repeated within a couple of weeks to make sure that there is no signs of a nodule in your lower lung.  This may be related to a shadow from your nipple however this could also be a nodule and a repeat chest x-ray will help. Return to the emergency department immediately for severe or worsening symptoms

## 2020-05-14 ENCOUNTER — Ambulatory Visit (INDEPENDENT_AMBULATORY_CARE_PROVIDER_SITE_OTHER): Payer: Medicare HMO | Admitting: Gastroenterology

## 2020-06-04 ENCOUNTER — Encounter (INDEPENDENT_AMBULATORY_CARE_PROVIDER_SITE_OTHER): Payer: Self-pay | Admitting: *Deleted

## 2020-08-20 ENCOUNTER — Ambulatory Visit (INDEPENDENT_AMBULATORY_CARE_PROVIDER_SITE_OTHER): Payer: Medicare HMO | Admitting: Gastroenterology

## 2020-09-29 ENCOUNTER — Other Ambulatory Visit: Payer: Self-pay | Admitting: Orthopedic Surgery

## 2020-10-14 NOTE — Progress Notes (Signed)
DUE TO COVID-19 ONLY ONE VISITOR IS ALLOWED TO COME WITH YOU AND STAY IN THE WAITING ROOM ONLY DURING PRE OP AND PROCEDURE DAY OF SURGERY.  2 VISITOR  MAY VISIT WITH YOU AFTER SURGERY IN YOUR PRIVATE ROOM DURING VISITING HOURS ONLY!  YOU NEED TO HAVE A COVID 19 TEST ON__9/15/22 ____'@_'$  '@_from'$  8am-3pm _____, THIS TEST MUST BE DONE BEFORE SURGERY,  Covid test is done at [place], [place] [address].  This is a drive thru.  No appt required. Please see map.                 Your procedure is scheduled on:  10/27/2020   Report to [facility] Main  Entrance   Report to admitting at    [phone]     Call this number if you have problems the morning of surgery [phone]    REMEMBER: NO  SOLID FOOD CANDY OR GUM AFTER MIDNIGHT. CLEAR LIQUIDS UNTIL   0600AM       . NOTHING BY MOUTH EXCEPT CLEAR LIQUIDS UNTIL 0600AM    . PLEASE FINISH ENSURE DRINK PER SURGEON ORDER  WHICH NEEDS TO BE COMPLETED AT    0600AM   .      CLEAR LIQUID DIET   Foods Allowed                                                                    Coffee and tea, regular and decaf                            Fruit ices (not with fruit pulp)                                      Iced Popsicles                                    Carbonated beverages, regular and diet                                    Cranberry, grape and apple juices Sports drinks like Gatorade Lightly seasoned clear broth or consume(fat free) Sugar, honey syrup ___________________________________________________________________      BRUSH YOUR TEETH MORNING OF SURGERY AND RINSE YOUR MOUTH OUT, NO CHEWING GUM CANDY OR MINTS.     Take these medicines the morning of surgery with A SIP OF WATER:  METOPROLOL, GABAPENTIN, CYMBALTA, AMLODIPINE, COREG, CITALOPRAM   DO NOT TAKE ANY DIABETIC MEDICATIONS DAY OF YOUR SURGERY                               You may not have any metal on your body including hair pins and              piercings   Do not wear jewelry, make-up, lotions, powders or perfumes, deodorant  Do not wear nail polish on your fingernails.  Do not shave  48 hours prior to surgery.              Men may shave face and neck.   Do not bring valuables to the hospital. [place].  Contacts, dentures or bridgework may not be worn into surgery.  Leave suitcase in the car. After surgery it may be brought to your room.     Patients discharged the day of surgery will not be allowed to drive home. IF YOU ARE HAVING SURGERY AND GOING HOME THE SAME DAY, YOU MUST HAVE AN ADULT TO DRIVE YOU HOME AND BE WITH YOU FOR 24 HOURS. YOU MAY GO HOME BY TAXI OR UBER OR ORTHERWISE, BUT AN ADULT MUST ACCOMPANY YOU HOME AND STAY WITH YOU FOR 24 HOURS.  Name and phone number of your driver:  Special Instructions: N/A              Please read over the following fact sheets you were given: _____________________________________________________________________  [facility] - Preparing for Surgery Before surgery, you can play an important role.  Because skin is not sterile, your skin needs to be as free of germs as possible.  You can reduce the number of germs on your skin by washing with CHG (chlorahexidine gluconate) soap before surgery.  CHG is an antiseptic cleaner which kills germs and bonds with the skin to continue killing germs even after washing. Please DO NOT use if you have an allergy to CHG or antibacterial soaps.  If your skin becomes reddened/irritated stop using the CHG and inform your nurse when you arrive at Short Stay. Do not shave (including legs and underarms) for at least 48 hours prior to the first CHG shower.  You may shave your face/neck. Please follow these instructions carefully:  1.  Shower with CHG Soap the night before surgery and the  morning of Surgery.  2.  If you choose to wash your hair, wash your hair first as usual with your  normal  shampoo.  3.  After  you shampoo, rinse your hair and body thoroughly to remove the  shampoo.                           4.  Use CHG as you would any other liquid soap.  You can apply chg directly  to the skin and wash                       Gently with a scrungie or clean washcloth.  5.  Apply the CHG Soap to your body ONLY FROM THE NECK DOWN.   Do not use on face/ open                           Wound or open sores. Avoid contact with eyes, ears mouth and genitals (private parts).                       Wash face,  Genitals (private parts) with your normal soap.             6.  Wash thoroughly, paying special attention to the area where your surgery  will be performed.  7.  Thoroughly rinse your body with  warm water from the neck down.  8.  DO NOT shower/wash with your normal soap after using and rinsing off  the CHG Soap.                9.  Pat yourself dry with a clean towel.            10.  Wear clean pajamas.            11.  Place clean sheets on your bed the night of your first shower and do not  sleep with pets. Day of Surgery : Do not apply any lotions/deodorants the morning of surgery.  Please wear clean clothes to the hospital/surgery center.  FAILURE TO FOLLOW THESE INSTRUCTIONS MAY RESULT IN THE CANCELLATION OF YOUR SURGERY PATIENT SIGNATURE_________________________________  NURSE SIGNATURE__________________________________  ________________________________________________________________________

## 2020-10-15 NOTE — Progress Notes (Addendum)
COVID swab appointment: 10/23/20  COVID Vaccine Completed: No Date COVID Vaccine completed: Has received booster: COVID vaccine manufacturer: [place]   Date of COVID positive in last 90 days: No  PCP - Curtis Sites, MD Cardiologist - [facility]   Chest x-ray - 04/27/20 Epic EKG - 09/29/20 [facility] Stress Test - 01/22/15 Care Everywhere ECHO - 05/27/16 Care Everywhere Cardiac Cath - yes 4 years ago per pt Pacemaker/ICD device last checked: N/a Spinal Cord Stimulator: N/a  Sleep Study - yes positive CPAP - some nights per pt  Fasting Blood Sugar - pre DM Checks Blood Sugar _____ times a day  Blood Thinner Instructions: Plavix, hold 3-7 days, noinstructions Aspirin Instructions: ASA 81, continue  Last Dose:  Activity level: Can go up a flight of stairs and perform activities of daily living without stopping and without symptoms of chest pain or shortness of breath. Difficulty with stairs due to arthritis       Anesthesia review: HTN, CAD, multiple stent, OSA, SOB   Patient denies shortness of breath, fever, cough and chest pain at PAT appointment  Interviewed patient via phone. Coming in for labs 10/23/20.   Patient verbalized understanding of instructions that were given to them at the PAT appointment. Patient was also instructed that they will need to review over the PAT instructions again at home before surgery.

## 2020-10-15 NOTE — Patient Instructions (Addendum)
DUE TO COVID-19 ONLY ONE VISITOR IS ALLOWED TO COME WITH YOU AND STAY IN THE WAITING ROOM ONLY DURING PRE OP AND PROCEDURE.   **NO VISITORS ARE ALLOWED IN THE SHORT STAY AREA OR RECOVERY ROOM!!**  IF YOU WILL BE ADMITTED INTO THE HOSPITAL YOU ARE ALLOWED ONLY TWO SUPPORT PEOPLE DURING VISITATION HOURS ONLY (10AM -8PM)   The support person(s) may change daily. The support person(s) must pass our screening, gel in and out, and wear a mask at all times, including in the patient's room. Patients must also wear a mask when staff or their support person are in the room.  No visitors under the age of 41. Any visitor under the age of 1 must be accompanied by an adult.    COVID SWAB TESTING MUST BE COMPLETED ON:  10/23/20 **MUST PRESENT COMPLETED FORM AT TESTING SITE**    [place] (backside of the building) Open 8am-3pm. No appointment needed. You are not required to quarantine, however you are required to wear a well-fitted mask when you are out and around people not in your household.  Hand Hygiene often Do NOT share personal items Notify your provider if you are in close contact with someone who has COVID or you develop fever 100.4 or greater, new onset of sneezing, cough, sore throat, shortness of breath or body aches.       Your procedure is scheduled on: 10/27/20   Report to [facility] Main  Entrance    Report to admitting at 6:15 AM   Call this number if you have problems the morning of surgery [phone]   Do not eat food :After Midnight.   May have liquids until 6:00 AM day of surgery  CLEAR LIQUID DIET  Foods Allowed                                                                     Foods Excluded  Water, Black Coffee and tea (No milk or creamer)          liquids that you cannot  Plain Jell-O in any flavor  (No red)                                     see through such as: Fruit ices (not with fruit pulp)                                             milk, soups, orange juice              Iced Popsicles (No red)                                               All solid food  Apple juices Sports drinks like Gatorade (No red) Lightly seasoned clear broth or consume(fat free) Sugar, honey syrup     The day of surgery:  Drink ONE (1) Pre-Surgery Clear Ensure by 6:00 am the morning of surgery. Drink in one sitting. Do not sip.  This drink was given to you during your hospital  pre-op appointment visit. Nothing else to drink after completing the  Pre-Surgery Clear Ensure.          If you have questions, please contact your surgeon's office.     Oral Hygiene is also important to reduce your risk of infection.                                    Remember - BRUSH YOUR TEETH THE MORNING OF SURGERY WITH YOUR REGULAR TOOTHPASTE   Do NOT smoke after Midnight   Take these medicines the morning of surgery with A SIP OF WATER: Amlodipine, Coreg, Celexa, Cymbalta, Proscar, Gabapentin, Lopressor, Crestor.                               You may not have any metal on your body including jewelry, and body piercing             Do not wear lotions, powders, cologne, or deodorant              Men may shave face and neck.   Do not bring valuables to the hospital. [place].   Contacts, dentures or bridgework may not be worn into surgery.   Bring small overnight bag day of surgery.   Please read over the following fact sheets you were given: IF YOU HAVE QUESTIONS ABOUT YOUR PRE OP INSTRUCTIONS PLEASE CALL [facility] - Preparing for Surgery Before surgery, you can play an important role.  Because skin is not sterile, your skin needs to be as free of germs as possible.  You can reduce the number of germs on your skin by washing with CHG (chlorahexidine gluconate) soap before surgery.  CHG is an antiseptic cleaner which kills germs and bonds  with the skin to continue killing germs even after washing. Please DO NOT use if you have an allergy to CHG or antibacterial soaps.  If your skin becomes reddened/irritated stop using the CHG and inform your nurse when you arrive at Short Stay. Do not shave (including legs and underarms) for at least 48 hours prior to the first CHG shower.  You may shave your face/neck.  Please follow these instructions carefully:  1.  Shower with CHG Soap the night before surgery and the  morning of surgery.  2.  If you choose to wash your hair, wash your hair first as usual with your normal  shampoo.  3.  After you shampoo, rinse your hair and body thoroughly to remove the shampoo.                             4.  Use CHG as you would any other liquid soap.  You can apply chg directly to the skin and wash.  Gently with a scrungie or clean washcloth.  5.  Apply the CHG Soap to your  body ONLY FROM THE NECK DOWN.   Do   not use on face/ open                           Wound or open sores. Avoid contact with eyes, ears mouth and   genitals (private parts).                       Wash face,  Genitals (private parts) with your normal soap.             6.  Wash thoroughly, paying special attention to the area where your    surgery  will be performed.  7.  Thoroughly rinse your body with warm water from the neck down.  8.  DO NOT shower/wash with your normal soap after using and rinsing off the CHG Soap.                9.  Pat yourself dry with a clean towel.            10.  Wear clean pajamas.            11.  Place clean sheets on your bed the night of your first shower and do not  sleep with pets. Day of Surgery : Do not apply any lotions/deodorants the morning of surgery.  Please wear clean clothes to the hospital/surgery center.  FAILURE TO FOLLOW THESE INSTRUCTIONS MAY RESULT IN THE CANCELLATION OF YOUR SURGERY  PATIENT SIGNATURE_________________________________  NURSE  SIGNATURE__________________________________  ________________________________________________________________________   Adam Phenix  An incentive spirometer is a tool that can help keep your lungs clear and active. This tool measures how well you are filling your lungs with each breath. Taking long deep breaths may help reverse or decrease the chance of developing breathing (pulmonary) problems (especially infection) following: A long period of time when you are unable to move or be active. BEFORE THE PROCEDURE  If the spirometer includes an indicator to show your best effort, your nurse or respiratory therapist will set it to a desired goal. If possible, sit up straight or lean slightly forward. Try not to slouch. Hold the incentive spirometer in an upright position. INSTRUCTIONS FOR USE  Sit on the edge of your bed if possible, or sit up as far as you can in bed or on a chair. Hold the incentive spirometer in an upright position. Breathe out normally. Place the mouthpiece in your mouth and seal your lips tightly around it. Breathe in slowly and as deeply as possible, raising the piston or the ball toward the top of the column. Hold your breath for 3-5 seconds or for as long as possible. Allow the piston or ball to fall to the bottom of the column. Remove the mouthpiece from your mouth and breathe out normally. Rest for a few seconds and repeat Steps 1 through 7 at least 10 times every 1-2 hours when you are awake. Take your time and take a few normal breaths between deep breaths. The spirometer may include an indicator to show your best effort. Use the indicator as a goal to work toward during each repetition. After each set of 10 deep breaths, practice coughing to be sure your lungs are clear. If you have an incision (the cut made at the time of surgery), support your incision when coughing by placing a pillow or rolled up towels firmly against it. Once you are able to get out  of  bed, walk around indoors and cough well. You may stop using the incentive spirometer when instructed by your caregiver.  RISKS AND COMPLICATIONS Take your time so you do not get dizzy or light-headed. If you are in pain, you may need to take or ask for pain medication before doing incentive spirometry. It is harder to take a deep breath if you are having pain. AFTER USE Rest and breathe slowly and easily. It can be helpful to keep track of a log of your progress. Your caregiver can provide you with a simple table to help with this. If you are using the spirometer at home, follow these instructions: [place] IF:  You are having difficultly using the spirometer. You have trouble using the spirometer as often as instructed. Your pain medication is not giving enough relief while using the spirometer. You develop fever of 100.5 F (38.1 C) or higher. SEEK IMMEDIATE MEDICAL CARE IF:  You cough up bloody sputum that had not been present before. You develop fever of 102 F (38.9 C) or greater. You develop worsening pain at or near the incision site. MAKE SURE YOU:  Understand these instructions. Will watch your condition. Will get help right away if you are not doing well or get worse. Document Released: 06/07/2006 Document Revised: 04/19/2011 Document Reviewed: 08/08/2006 [facility] Patient Information 2014 [place], [place].   ________________________________________________________________________

## 2020-10-16 ENCOUNTER — Encounter (HOSPITAL_COMMUNITY): Payer: Self-pay

## 2020-10-16 ENCOUNTER — Other Ambulatory Visit: Payer: Self-pay

## 2020-10-16 ENCOUNTER — Encounter (HOSPITAL_COMMUNITY)
Admission: RE | Admit: 2020-10-16 | Discharge: 2020-10-16 | Disposition: A | Discharge status: Home / Self Care | Payer: Medicare HMO | Source: Ambulatory Visit | Attending: Orthopedic Surgery | Admitting: Orthopedic Surgery

## 2020-10-16 HISTORY — DX: Personal history of urinary calculi: Z87.442

## 2020-10-16 HISTORY — DX: Dyspnea, unspecified: R06.00

## 2020-10-16 HISTORY — DX: Sleep apnea, unspecified: G47.30

## 2020-10-16 HISTORY — DX: Prediabetes: R73.03

## 2020-10-16 NOTE — Progress Notes (Signed)
On med list pt is on 2 beta blockers. - coreg and Lopressor.  Made Mariann Barter, RN aware to question pt at preop.

## 2020-10-20 ENCOUNTER — Other Ambulatory Visit: Payer: Self-pay

## 2020-10-20 ENCOUNTER — Encounter (HOSPITAL_COMMUNITY)
Admission: RE | Admit: 2020-10-20 | Discharge: 2020-10-20 | Disposition: A | Discharge status: Home / Self Care | Payer: Medicare HMO | Source: Ambulatory Visit | Attending: Orthopedic Surgery | Admitting: Orthopedic Surgery

## 2020-10-20 DIAGNOSIS — I10 Essential (primary) hypertension: Secondary | ICD-10-CM | POA: Insufficient documentation

## 2020-10-20 DIAGNOSIS — Z01812 Encounter for preprocedural laboratory examination: Secondary | ICD-10-CM | POA: Diagnosis not present

## 2020-10-20 LAB — SURGICAL PCR SCREEN
MRSA, PCR: NEGATIVE
Staphylococcus aureus: NEGATIVE

## 2020-10-20 LAB — CBC WITH DIFFERENTIAL/PLATELET
Abs Immature Granulocytes: 0.02 10*3/uL (ref 0.00–0.07)
Basophils Absolute: 0 10*3/uL (ref 0.0–0.1)
Basophils Relative: 0 %
Eosinophils Absolute: 0.2 10*3/uL (ref 0.0–0.5)
Eosinophils Relative: 3 %
HCT: 45.3 % (ref 39.0–52.0)
Hemoglobin: 14.7 g/dL (ref 13.0–17.0)
Immature Granulocytes: 0 %
Lymphocytes Relative: 25 %
Lymphs Abs: 1.7 10*3/uL (ref 0.7–4.0)
MCH: 28 pg (ref 26.0–34.0)
MCHC: 32.5 g/dL (ref 30.0–36.0)
MCV: 86.3 fL (ref 80.0–100.0)
Monocytes Absolute: 0.5 10*3/uL (ref 0.1–1.0)
Monocytes Relative: 8 %
Neutro Abs: 4.1 10*3/uL (ref 1.7–7.7)
Neutrophils Relative %: 64 %
Platelets: 159 10*3/uL (ref 150–400)
RBC: 5.25 MIL/uL (ref 4.22–5.81)
RDW: 13.7 % (ref 11.5–15.5)
WBC: 6.5 10*3/uL (ref 4.0–10.5)
nRBC: 0 % (ref 0.0–0.2)

## 2020-10-20 LAB — HEMOGLOBIN A1C
Hgb A1c MFr Bld: 5.6 % (ref 4.8–5.6)
Mean Plasma Glucose: 114.02 mg/dL

## 2020-10-20 LAB — COMPREHENSIVE METABOLIC PANEL
ALT: 20 U/L (ref 0–44)
AST: 19 U/L (ref 15–41)
Albumin: 4.1 g/dL (ref 3.5–5.0)
Alkaline Phosphatase: 77 U/L (ref 38–126)
Anion gap: 7 (ref 5–15)
BUN: 14 mg/dL (ref 8–23)
CO2: 27 mmol/L (ref 22–32)
Calcium: 9.3 mg/dL (ref 8.9–10.3)
Chloride: 106 mmol/L (ref 98–111)
Creatinine, Ser: 1.14 mg/dL (ref 0.61–1.24)
GFR, Estimated: >60 mL/min (ref >60)
Glucose, Bld: 111 mg/dL — ABNORMAL HIGH (ref 70–99)
Potassium: 3.5 mmol/L (ref 3.5–5.1)
Sodium: 140 mmol/L (ref 135–145)
Total Bilirubin: 0.7 mg/dL (ref 0.3–1.2)
Total Protein: 6.7 g/dL (ref 6.5–8.1)

## 2020-10-22 NOTE — Anesthesia Preprocedure Evaluation (Addendum)
Anesthesia Evaluation  Patient identified by MRN, date of birth, ID band Patient awake    Reviewed: Allergy & Precautions, NPO status , Patient's Chart, lab work & pertinent test results  Airway Mallampati: II  TM Distance: >3 FB Neck ROM: Full    Dental no notable dental hx. (+) Edentulous Upper, Dental Advisory Given   Pulmonary sleep apnea and Continuous Positive Airway Pressure Ventilation , Patient abstained from smoking., former smoker,    Pulmonary exam normal breath sounds clear to auscultation       Cardiovascular hypertension, Pt. on medications and Pt. on home beta blockers + CAD and + Cardiac Stents  Normal cardiovascular exam Rhythm:Regular Rate:Normal     Neuro/Psych Depression  Neuromuscular disease    GI/Hepatic negative GI ROS, Neg liver ROS,   Endo/Other    Renal/GU Renal InsufficiencyRenal diseaseLab Results      Component                Value               Date                      CREATININE               1.14                10/20/2020                BUN                      14                  10/20/2020                NA                       140                 10/20/2020                K                        3.5                 10/20/2020                CL                       106                 10/20/2020                CO2                      27                  10/20/2020                Musculoskeletal  (+) Arthritis ,   Abdominal (+) + obese (BMI 32.69),   Peds  Hematology Lab Results      Component                Value               Date  WBC                      6.5                 10/20/2020                HGB                      14.7                10/20/2020                HCT                      45.3                10/20/2020                MCV                      86.3                10/20/2020                PLT                      159                  10/20/2020              Anesthesia Other Findings All: Atorvastatin, buprenorphine, Codeine, HCTZ, diclofonac, spironolactone  Reproductive/Obstetrics                          Anesthesia Physical Anesthesia Plan  ASA: 3  Anesthesia Plan: Spinal   Post-op Pain Management:  Regional for Post-op pain   Induction:   PONV Risk Score and Plan: 2 and Midazolam, Treatment may vary due to age or medical condition and Ondansetron  Airway Management Planned: Natural Airway  Additional Equipment: None  Intra-op Plan:   Post-operative Plan:   Informed Consent: I have reviewed the patients History and Physical, chart, labs and discussed the procedure including the risks, benefits and alternatives for the proposed anesthesia with the patient or authorized representative who has indicated his/her understanding and acceptance.     Dental advisory given  Plan Discussed with: CRNA  Anesthesia Plan Comments: (See PAT note 10/16/2020, Konrad Felix Ward, PA-C  Sp w R adductor canal block  Check last Plavix)      Anesthesia Quick Evaluation

## 2020-10-22 NOTE — Progress Notes (Addendum)
Anesthesia Chart Review   Case: [id number] Date/Time: 10/27/20 0745   Procedure: TOTAL KNEE ARTHROPLASTY (Right: Knee)   Anesthesia type: Spinal   Pre-op diagnosis: Osteoarthritis of right knee M17.11   Location: [facility] ROOM 06 / [facility]   Surgeons: Vickey Huger, MD       DISCUSSION:65 y.o. former smoker with h/o HTN, sleep apnea, CAD (DES 09/2013), PVD, CKD, pre-diabetes, right knee OA scheduled for above procedure 10/27/2020 with Dr. Vickey Huger.   Pt seen by cardiology 09/29/20 for preoperative evaluation.  Per OV note, "Euvolemic on examination at this time with no active HF, arrhythmia or unstable anginal symptoms; able to perform 4 METs without symptoms - Moderate risk for MACE with non-vascular procedures - No anginal symptoms with relatively recent MPI; based on shared decision making, we will forgo repeat stress test - Plavix may be held 5 to 7 days prior to planned procedure, however ASA should be continued perioperatively and not interrupted given his extensive disease; resume DaPT after surgery ASAP - Coreg should not be interrupted for upcoming procedure and continued perioperatively"  Plavix instructions reviewed by Carlyon Shadow, PA-C with pt at preop visit.   Anticipate pt can proceed with planned procedure barring acute status change.   VS: There were no vitals taken for this visit.  PROVIDERS: Zhou-Talbert, Elwyn Lade, MD is PCP   Assar, Jodelle Gross, DO is Cardiologist  LABS: Labs reviewed: Acceptable for surgery. (all labs ordered are listed, but only abnormal results are displayed)  Labs Reviewed - No data to display   IMAGES:   EKG: 04/28/2020 Rate 61 bpm  Sinus rhythm   CV: Echo 05/27/2016 INTERPRETATION  NORMAL LEFT VENTRICULAR SYSTOLIC FUNCTION   WITH MILD LVH  NORMAL RIGHT VENTRICULAR SYSTOLIC FUNCTION  NO VALVULAR REGURGITATION  NO VALVULAR STENOSIS    Stress Test 01/22/2015 Impressions:  - Normal myocardial perfusion study   - No evidence for significant  ischemia or scar is noted.   - Post stress:  Global systolic function is normal.  The ejection fraction  was greater than 65%.  Past Medical History:  Diagnosis Date   Arthritis    Chronic kidney disease    Coronary artery disease    Depression    Dizziness    Dyspnea    Heart disease    High cholesterol    History of kidney stones    Hypertension    Kidney stones    Polyp of colon    Pre-diabetes    Sleep apnea     Past Surgical History:  Procedure Laterality Date   CARDIAC CATHETERIZATION     COLONOSCOPY WITH PROPOFOL N/A 05/09/2015   Procedure: COLONOSCOPY WITH PROPOFOL;  Surgeon: Rogene Houston, MD;  Location: [facility];  Service: Endoscopy;  Laterality: N/A;  10:30 - Ann to notify pt to arrive at [place]  10/04/13   kidney stones      MEDICATIONS:  amLODipine (NORVASC) 5 MG tablet   aspirin EC 81 MG tablet   carvedilol (COREG) 25 MG tablet   clopidogrel (PLAVIX) 75 MG tablet   DULoxetine (CYMBALTA) 30 MG capsule   finasteride (PROSCAR) 5 MG tablet   gabapentin (NEURONTIN) 800 MG tablet   HYDROcodone-acetaminophen (NORCO) 10-325 MG tablet   lisinopril (ZESTRIL) 20 MG tablet   metFORMIN (GLUCOPHAGE) 500 MG tablet   rosuvastatin (CRESTOR) 40 MG tablet   sildenafil (VIAGRA) 50 MG tablet   tamsulosin (FLOMAX) 0.4 MG CAPS capsule   No current facility-administered  medications for this encounter.    Konrad Felix Ward, PA-C [facility] Pre-Surgical Testing [phone]

## 2020-10-23 ENCOUNTER — Other Ambulatory Visit: Payer: Self-pay | Admitting: Orthopedic Surgery

## 2020-10-23 ENCOUNTER — Encounter (HOSPITAL_COMMUNITY): Admission: RE | Admit: 2020-10-23 | Payer: Medicare HMO | Source: Ambulatory Visit

## 2020-10-23 LAB — SARS CORONAVIRUS 2 (TAT 6-24 HRS): SARS Coronavirus 2: NEGATIVE

## 2020-10-27 ENCOUNTER — Encounter (HOSPITAL_COMMUNITY)
Admission: RE | Disposition: A | Discharge status: Home / Self Care | Payer: Self-pay | Source: Home / Self Care | Attending: Orthopedic Surgery

## 2020-10-27 ENCOUNTER — Observation Stay (HOSPITAL_COMMUNITY)
Admission: RE | Admit: 2020-10-27 | Discharge: 2020-10-27 | Disposition: A | Discharge status: Home / Self Care | Payer: Medicare HMO | Attending: Orthopedic Surgery | Admitting: Orthopedic Surgery

## 2020-10-27 ENCOUNTER — Ambulatory Visit (HOSPITAL_COMMUNITY): Payer: Medicare HMO | Admitting: Certified Registered Nurse Anesthetist

## 2020-10-27 ENCOUNTER — Encounter (HOSPITAL_COMMUNITY): Payer: Self-pay | Admitting: Orthopedic Surgery

## 2020-10-27 ENCOUNTER — Other Ambulatory Visit: Payer: Self-pay

## 2020-10-27 ENCOUNTER — Ambulatory Visit (HOSPITAL_COMMUNITY): Payer: Medicare HMO | Admitting: Physician Assistant

## 2020-10-27 DIAGNOSIS — N189 Chronic kidney disease, unspecified: Secondary | ICD-10-CM | POA: Insufficient documentation

## 2020-10-27 DIAGNOSIS — M1711 Unilateral primary osteoarthritis, right knee: Secondary | ICD-10-CM | POA: Diagnosis not present

## 2020-10-27 DIAGNOSIS — Z79899 Other long term (current) drug therapy: Secondary | ICD-10-CM | POA: Diagnosis not present

## 2020-10-27 DIAGNOSIS — Z96659 Presence of unspecified artificial knee joint: Secondary | ICD-10-CM

## 2020-10-27 DIAGNOSIS — I129 Hypertensive chronic kidney disease with stage 1 through stage 4 chronic kidney disease, or unspecified chronic kidney disease: Secondary | ICD-10-CM | POA: Diagnosis not present

## 2020-10-27 DIAGNOSIS — Z7982 Long term (current) use of aspirin: Secondary | ICD-10-CM | POA: Diagnosis not present

## 2020-10-27 DIAGNOSIS — R7303 Prediabetes: Secondary | ICD-10-CM | POA: Insufficient documentation

## 2020-10-27 DIAGNOSIS — Z87891 Personal history of nicotine dependence: Secondary | ICD-10-CM | POA: Insufficient documentation

## 2020-10-27 DIAGNOSIS — Z7902 Long term (current) use of antithrombotics/antiplatelets: Secondary | ICD-10-CM | POA: Diagnosis not present

## 2020-10-27 DIAGNOSIS — I2510 Atherosclerotic heart disease of native coronary artery without angina pectoris: Secondary | ICD-10-CM | POA: Insufficient documentation

## 2020-10-27 DIAGNOSIS — Z7984 Long term (current) use of oral hypoglycemic drugs: Secondary | ICD-10-CM | POA: Insufficient documentation

## 2020-10-27 HISTORY — PX: TOTAL KNEE ARTHROPLASTY: SHX125

## 2020-10-27 LAB — GLUCOSE, CAPILLARY
Glucose-Capillary: 128 mg/dL — ABNORMAL HIGH (ref 70–99)
Glucose-Capillary: 116 mg/dL — ABNORMAL HIGH (ref 70–99)

## 2020-10-27 SURGERY — ARTHROPLASTY, KNEE, TOTAL
Anesthesia: Spinal | Site: Knee | Laterality: Right

## 2020-10-27 MED ORDER — BISACODYL 5 MG PO TBEC: 5.0000 mg | DELAYED_RELEASE_TABLET | Freq: Every day | ORAL | Status: DC | PRN

## 2020-10-27 MED ORDER — BUPIVACAINE-EPINEPHRINE (PF) 0.25% -1:200000 IJ SOLN
INTRAMUSCULAR | Status: AC
  Filled 2020-10-27: qty 30

## 2020-10-27 MED ORDER — AMISULPRIDE (ANTIEMETIC) 5 MG/2ML IV SOLN: 10.0000 mg | Freq: Once | INTRAVENOUS | Status: DC | PRN

## 2020-10-27 MED ORDER — BUPIVACAINE IN DEXTROSE 0.75-8.25 % IT SOLN
INTRATHECAL | Status: DC | PRN
  Administered 2020-10-27: 13.5 mg via INTRATHECAL

## 2020-10-27 MED ORDER — METOCLOPRAMIDE HCL 5 MG PO TABS: 5.0000 mg | ORAL_TABLET | Freq: Three times a day (TID) | ORAL | Status: DC | PRN

## 2020-10-27 MED ORDER — FENTANYL CITRATE (PF) 100 MCG/2ML IJ SOLN
INTRAMUSCULAR | Status: AC
  Filled 2020-10-27: qty 2

## 2020-10-27 MED ORDER — MIDAZOLAM HCL 2 MG/2ML IJ SOLN
INTRAMUSCULAR | Status: AC
  Filled 2020-10-27: qty 2

## 2020-10-27 MED ORDER — TAMSULOSIN HCL 0.4 MG PO CAPS: 0.4000 mg | ORAL_CAPSULE | Freq: Every day | ORAL | Status: DC

## 2020-10-27 MED ORDER — METFORMIN HCL 500 MG PO TABS: 500.0000 mg | ORAL_TABLET | Freq: Every day | ORAL | Status: DC

## 2020-10-27 MED ORDER — OXYCODONE HCL 5 MG PO TABS: 5.0000 mg | ORAL_TABLET | Freq: Once | ORAL | Status: DC | PRN

## 2020-10-27 MED ORDER — SODIUM CHLORIDE 0.9 % IV SOLN: INTRAVENOUS | Status: DC

## 2020-10-27 MED ORDER — DEXAMETHASONE SODIUM PHOSPHATE 10 MG/ML IJ SOLN: 10.0000 mg | Freq: Once | INTRAMUSCULAR | Status: DC

## 2020-10-27 MED ORDER — PROPOFOL 10 MG/ML IV BOLUS
INTRAVENOUS | Status: DC | PRN
  Administered 2020-10-27 (×2): 10 mg via INTRAVENOUS

## 2020-10-27 MED ORDER — CLONIDINE HCL (ANALGESIA) 100 MCG/ML EP SOLN
EPIDURAL | Status: DC | PRN
  Administered 2020-10-27: 100 ug

## 2020-10-27 MED ORDER — ASPIRIN EC 81 MG PO TBEC: 81.0000 mg | DELAYED_RELEASE_TABLET | Freq: Every morning | ORAL | Status: DC

## 2020-10-27 MED ORDER — FINASTERIDE 5 MG PO TABS: 5.0000 mg | ORAL_TABLET | Freq: Every day | ORAL | Status: DC

## 2020-10-27 MED ORDER — ONDANSETRON HCL 4 MG/2ML IJ SOLN: 4.0000 mg | Freq: Four times a day (QID) | INTRAMUSCULAR | Status: DC | PRN

## 2020-10-27 MED ORDER — ONDANSETRON HCL 4 MG/2ML IJ SOLN
INTRAMUSCULAR | Status: AC
  Filled 2020-10-27: qty 2

## 2020-10-27 MED ORDER — GABAPENTIN 400 MG PO CAPS: 800.0000 mg | ORAL_CAPSULE | Freq: Every evening | ORAL | Status: DC | PRN

## 2020-10-27 MED ORDER — DEXAMETHASONE SODIUM PHOSPHATE 10 MG/ML IJ SOLN
8.0000 mg | Freq: Once | INTRAMUSCULAR | Status: AC
  Administered 2020-10-27: 8 mg via INTRAVENOUS

## 2020-10-27 MED ORDER — ACETAMINOPHEN 500 MG PO TABS
1000.0000 mg | ORAL_TABLET | Freq: Four times a day (QID) | ORAL | Status: DC
  Administered 2020-10-27 (×2): 1000 mg via ORAL
  Filled 2020-10-27 (×2): qty 2

## 2020-10-27 MED ORDER — GABAPENTIN 400 MG PO CAPS
800.0000 mg | ORAL_CAPSULE | Freq: Two times a day (BID) | ORAL | Status: DC
  Administered 2020-10-27: 800 mg via ORAL
  Filled 2020-10-27: qty 2

## 2020-10-27 MED ORDER — SENNOSIDES-DOCUSATE SODIUM 8.6-50 MG PO TABS: 1.0000 | ORAL_TABLET | Freq: Every evening | ORAL | Status: DC | PRN

## 2020-10-27 MED ORDER — METHOCARBAMOL 500 MG PO TABS: 500.0000 mg | ORAL_TABLET | Freq: Four times a day (QID) | ORAL | 0 refills | Status: AC | PRN

## 2020-10-27 MED ORDER — PROPOFOL 1000 MG/100ML IV EMUL
INTRAVENOUS | Status: AC
  Filled 2020-10-27: qty 100

## 2020-10-27 MED ORDER — HYDROMORPHONE HCL 2 MG PO TABS: 2.0000 mg | ORAL_TABLET | ORAL | 0 refills | Status: AC | PRN

## 2020-10-27 MED ORDER — PROPOFOL 500 MG/50ML IV EMUL
INTRAVENOUS | Status: DC | PRN
  Administered 2020-10-27: 75 ug/kg/min via INTRAVENOUS

## 2020-10-27 MED ORDER — METHOCARBAMOL 500 MG IVPB - SIMPLE MED
500.0000 mg | Freq: Four times a day (QID) | INTRAVENOUS | Status: DC | PRN
  Filled 2020-10-27: qty 50

## 2020-10-27 MED ORDER — BUPIVACAINE LIPOSOME 1.3 % IJ SUSP
INTRAMUSCULAR | Status: DC | PRN
  Administered 2020-10-27: 20 mL

## 2020-10-27 MED ORDER — ZOLPIDEM TARTRATE 5 MG PO TABS: 5.0000 mg | ORAL_TABLET | Freq: Every evening | ORAL | Status: DC | PRN

## 2020-10-27 MED ORDER — EPHEDRINE 5 MG/ML INJ
INTRAVENOUS | Status: AC
  Filled 2020-10-27: qty 5

## 2020-10-27 MED ORDER — FERROUS SULFATE 325 (65 FE) MG PO TABS
325.0000 mg | ORAL_TABLET | Freq: Three times a day (TID) | ORAL | Status: DC
  Administered 2020-10-27: 325 mg via ORAL
  Filled 2020-10-27: qty 1

## 2020-10-27 MED ORDER — BUPIVACAINE LIPOSOME 1.3 % IJ SUSP: 20.0000 mL | Freq: Once | INTRAMUSCULAR | Status: DC

## 2020-10-27 MED ORDER — SODIUM CHLORIDE (PF) 0.9 % IJ SOLN
INTRAMUSCULAR | Status: AC
  Filled 2020-10-27: qty 20

## 2020-10-27 MED ORDER — METHOCARBAMOL 500 MG PO TABS
500.0000 mg | ORAL_TABLET | Freq: Four times a day (QID) | ORAL | Status: DC | PRN
  Administered 2020-10-27: 500 mg via ORAL
  Filled 2020-10-27: qty 1

## 2020-10-27 MED ORDER — MIDAZOLAM HCL 5 MG/5ML IJ SOLN
INTRAMUSCULAR | Status: DC | PRN
  Administered 2020-10-27: 2 mg via INTRAVENOUS

## 2020-10-27 MED ORDER — CEFAZOLIN SODIUM-DEXTROSE 2-4 GM/100ML-% IV SOLN
2.0000 g | INTRAVENOUS | Status: AC
  Administered 2020-10-27: 2 g via INTRAVENOUS
  Filled 2020-10-27: qty 100

## 2020-10-27 MED ORDER — SODIUM CHLORIDE 0.9% FLUSH
INTRAVENOUS | Status: DC | PRN
  Administered 2020-10-27: 20 mL

## 2020-10-27 MED ORDER — WATER FOR IRRIGATION, STERILE IR SOLN
Status: DC | PRN
  Administered 2020-10-27: 2000 mL

## 2020-10-27 MED ORDER — BUPIVACAINE-EPINEPHRINE 0.25% -1:200000 IJ SOLN
INTRAMUSCULAR | Status: DC | PRN
  Administered 2020-10-27: 30 mL

## 2020-10-27 MED ORDER — EPHEDRINE SULFATE-NACL 50-0.9 MG/10ML-% IV SOSY
PREFILLED_SYRINGE | INTRAVENOUS | Status: DC | PRN
  Administered 2020-10-27 (×2): 5 mg via INTRAVENOUS

## 2020-10-27 MED ORDER — METOCLOPRAMIDE HCL 5 MG/ML IJ SOLN: 5.0000 mg | Freq: Three times a day (TID) | INTRAMUSCULAR | Status: DC | PRN

## 2020-10-27 MED ORDER — LISINOPRIL 20 MG PO TABS: 20.0000 mg | ORAL_TABLET | Freq: Every evening | ORAL | Status: DC | PRN

## 2020-10-27 MED ORDER — DIPHENHYDRAMINE HCL 12.5 MG/5ML PO ELIX: 12.5000 mg | ORAL_SOLUTION | ORAL | Status: DC | PRN

## 2020-10-27 MED ORDER — CEFAZOLIN SODIUM-DEXTROSE 2-4 GM/100ML-% IV SOLN
2.0000 g | Freq: Four times a day (QID) | INTRAVENOUS | Status: DC
Start: 2020-10-27 — End: 2020-10-27
  Administered 2020-10-27: 2 g via INTRAVENOUS
  Filled 2020-10-27: qty 100

## 2020-10-27 MED ORDER — ONDANSETRON HCL 4 MG/2ML IJ SOLN: 4.0000 mg | Freq: Once | INTRAMUSCULAR | Status: DC | PRN

## 2020-10-27 MED ORDER — ACETAMINOPHEN 10 MG/ML IV SOLN: 1000.0000 mg | Freq: Once | INTRAVENOUS | Status: DC | PRN

## 2020-10-27 MED ORDER — ROPIVACAINE HCL 5 MG/ML IJ SOLN
INTRAMUSCULAR | Status: DC | PRN
  Administered 2020-10-27: 30 mL via PERINEURAL

## 2020-10-27 MED ORDER — DEXMEDETOMIDINE (PRECEDEX) IN NS 20 MCG/5ML (4 MCG/ML) IV SYRINGE
PREFILLED_SYRINGE | INTRAVENOUS | Status: DC | PRN
  Administered 2020-10-27 (×2): 8 ug via INTRAVENOUS
  Administered 2020-10-27: 4 ug via INTRAVENOUS

## 2020-10-27 MED ORDER — PHENYLEPHRINE HCL (PRESSORS) 10 MG/ML IV SOLN
INTRAVENOUS | Status: AC
  Filled 2020-10-27: qty 2

## 2020-10-27 MED ORDER — HYDROMORPHONE HCL 1 MG/ML IJ SOLN: 0.2500 mg | INTRAMUSCULAR | Status: DC | PRN

## 2020-10-27 MED ORDER — DOCUSATE SODIUM 100 MG PO CAPS: 100.0000 mg | ORAL_CAPSULE | Freq: Two times a day (BID) | ORAL | Status: DC

## 2020-10-27 MED ORDER — PHENOL 1.4 % MT LIQD: 1.0000 | OROMUCOSAL | Status: DC | PRN

## 2020-10-27 MED ORDER — FENTANYL CITRATE (PF) 100 MCG/2ML IJ SOLN
INTRAMUSCULAR | Status: DC | PRN
  Administered 2020-10-27: 100 ug via INTRAVENOUS

## 2020-10-27 MED ORDER — SODIUM CHLORIDE 0.9 % IR SOLN
Status: DC | PRN
  Administered 2020-10-27: 1000 mL

## 2020-10-27 MED ORDER — POVIDONE-IODINE 10 % EX SWAB
2.0000 "application " | Freq: Once | CUTANEOUS | Status: AC
  Administered 2020-10-27: 2 via TOPICAL

## 2020-10-27 MED ORDER — TRANEXAMIC ACID-NACL 1000-0.7 MG/100ML-% IV SOLN
1000.0000 mg | INTRAVENOUS | Status: AC
  Administered 2020-10-27: 1000 mg via INTRAVENOUS
  Filled 2020-10-27: qty 100

## 2020-10-27 MED ORDER — DULOXETINE HCL 30 MG PO CPEP: 30.0000 mg | ORAL_CAPSULE | Freq: Every day | ORAL | Status: DC

## 2020-10-27 MED ORDER — OXYCODONE HCL 5 MG/5ML PO SOLN: 5.0000 mg | Freq: Once | ORAL | Status: DC | PRN

## 2020-10-27 MED ORDER — OXYCODONE HCL 5 MG PO TABS
10.0000 mg | ORAL_TABLET | ORAL | Status: DC | PRN
  Administered 2020-10-27 (×2): 15 mg via ORAL
  Filled 2020-10-27 (×2): qty 3

## 2020-10-27 MED ORDER — BUPIVACAINE LIPOSOME 1.3 % IJ SUSP
INTRAMUSCULAR | Status: AC
  Filled 2020-10-27: qty 20

## 2020-10-27 MED ORDER — DEXAMETHASONE SODIUM PHOSPHATE 10 MG/ML IJ SOLN
INTRAMUSCULAR | Status: AC
  Filled 2020-10-27: qty 1

## 2020-10-27 MED ORDER — CLOPIDOGREL BISULFATE 75 MG PO TABS: 75.0000 mg | ORAL_TABLET | Freq: Every morning | ORAL | Status: DC

## 2020-10-27 MED ORDER — ORAL CARE MOUTH RINSE
15.0000 mL | Freq: Once | OROMUCOSAL | Status: AC
  Administered 2020-10-27: 15 mL via OROMUCOSAL

## 2020-10-27 MED ORDER — ONDANSETRON HCL 4 MG PO TABS: 4.0000 mg | ORAL_TABLET | Freq: Four times a day (QID) | ORAL | Status: DC | PRN

## 2020-10-27 MED ORDER — PANTOPRAZOLE SODIUM 40 MG PO TBEC: 40.0000 mg | DELAYED_RELEASE_TABLET | Freq: Every day | ORAL | Status: DC

## 2020-10-27 MED ORDER — ACETAMINOPHEN 500 MG PO TABS
1000.0000 mg | ORAL_TABLET | Freq: Once | ORAL | Status: AC
  Administered 2020-10-27: 1000 mg via ORAL
  Filled 2020-10-27: qty 2

## 2020-10-27 MED ORDER — LACTATED RINGERS IV SOLN
INTRAVENOUS | Status: DC
  Administered 2020-10-27: 1000 mL via INTRAVENOUS

## 2020-10-27 MED ORDER — ONDANSETRON HCL 4 MG/2ML IJ SOLN
INTRAMUSCULAR | Status: DC | PRN
Start: 2020-10-27 — End: 2020-10-27
  Administered 2020-10-27: 4 mg via INTRAVENOUS

## 2020-10-27 MED ORDER — MENTHOL 3 MG MT LOZG: 1.0000 | LOZENGE | OROMUCOSAL | Status: DC | PRN

## 2020-10-27 MED ORDER — HYDROMORPHONE HCL 1 MG/ML IJ SOLN: 0.5000 mg | INTRAMUSCULAR | Status: DC | PRN

## 2020-10-27 MED ORDER — FLEET ENEMA 7-19 GM/118ML RE ENEM: 1.0000 | ENEMA | Freq: Once | RECTAL | Status: DC | PRN

## 2020-10-27 MED ORDER — ROSUVASTATIN CALCIUM 20 MG PO TABS: 40.0000 mg | ORAL_TABLET | Freq: Every day | ORAL | Status: DC

## 2020-10-27 MED ORDER — CHLORHEXIDINE GLUCONATE 0.12 % MT SOLN: 15.0000 mL | Freq: Once | OROMUCOSAL | Status: AC

## 2020-10-27 MED ORDER — GABAPENTIN 300 MG PO CAPS
300.0000 mg | ORAL_CAPSULE | Freq: Once | ORAL | Status: DC
  Filled 2020-10-27: qty 1

## 2020-10-27 MED ORDER — ALUM & MAG HYDROXIDE-SIMETH 200-200-20 MG/5ML PO SUSP: 30.0000 mL | ORAL | Status: DC | PRN

## 2020-10-27 MED ORDER — PROPOFOL 10 MG/ML IV BOLUS
INTRAVENOUS | Status: AC
  Filled 2020-10-27: qty 40

## 2020-10-27 MED ORDER — CARVEDILOL 25 MG PO TABS: 25.0000 mg | ORAL_TABLET | Freq: Two times a day (BID) | ORAL | Status: DC

## 2020-10-27 MED ORDER — GABAPENTIN 800 MG PO TABS: 800.0000 mg | ORAL_TABLET | ORAL | Status: DC

## 2020-10-27 MED ORDER — AMLODIPINE BESYLATE 5 MG PO TABS: 5.0000 mg | ORAL_TABLET | Freq: Every morning | ORAL | Status: DC

## 2020-10-27 SURGICAL SUPPLY — 55 items
ARTISURF 10M PLYR 10-11EF KNEE (Knees) ×2 IMPLANT
BAG COUNTER SPONGE SURGICOUNT (BAG) ×2 IMPLANT
BAG ZIPLOCK 12X15 (MISCELLANEOUS) ×2 IMPLANT
BLADE SAGITTAL 13X1.27X60 (BLADE) ×2 IMPLANT
BLADE SAW SGTL 18X1.27X75 (BLADE) ×2 IMPLANT
BLADE SURG 15 STRL LF DISP TIS (BLADE) ×1 IMPLANT
BLADE SURG 15 STRL SS (BLADE) ×1
BLADE SURG SZ10 CARB STEEL (BLADE) ×4 IMPLANT
BNDG ELASTIC 6X5.8 VLCR STR LF (GAUZE/BANDAGES/DRESSINGS) ×2 IMPLANT
BOWL SMART MIX CTS (DISPOSABLE) ×2 IMPLANT
CEMENT BONE SIMPLEX SPEEDSET (Cement) ×4 IMPLANT
COVER SURGICAL LIGHT HANDLE (MISCELLANEOUS) ×2 IMPLANT
CUFF TOURN SGL QUICK 34 (TOURNIQUET CUFF) ×1
CUFF TRNQT CYL 34X4.125X (TOURNIQUET CUFF) ×1 IMPLANT
DECANTER SPIKE VIAL GLASS SM (MISCELLANEOUS) ×4 IMPLANT
DRAPE INCISE IOBAN 66X45 STRL (DRAPES) ×10 IMPLANT
DRAPE U-SHAPE 47X51 STRL (DRAPES) ×2 IMPLANT
DRESSING AQUACEL AG SP 3.5X10 (GAUZE/BANDAGES/DRESSINGS) ×1 IMPLANT
DRSG AQUACEL AG ADV 3.5X10 (GAUZE/BANDAGES/DRESSINGS) ×2 IMPLANT
DRSG AQUACEL AG SP 3.5X10 (GAUZE/BANDAGES/DRESSINGS) ×2
DURAPREP 26ML APPLICATOR (WOUND CARE) ×4 IMPLANT
ELECT REM PT RETURN 15FT ADLT (MISCELLANEOUS) ×2 IMPLANT
FEMUR  CMT CCR STD SZ10 R KNEE (Knees) ×1 IMPLANT
FEMUR CMT CCR STD SZ10 R KNEE (Knees) ×1 IMPLANT
FEMUR CMTD CCR STD SZ10 R KNEE (Knees) ×1 IMPLANT
GLOVE SURG ENC TEXT LTX SZ7.5 (GLOVE) ×2 IMPLANT
GLOVE SURG ORTHO LTX SZ8 (GLOVE) ×6 IMPLANT
GLOVE SURG UNDER POLY LF SZ7.5 (GLOVE) ×2 IMPLANT
GLOVE SURG UNDER POLY LF SZ8.5 (GLOVE) ×4 IMPLANT
GOWN STRL REUS W/ TWL XL LVL3 (GOWN DISPOSABLE) ×2 IMPLANT
GOWN STRL REUS W/TWL XL LVL3 (GOWN DISPOSABLE) ×2
HANDPIECE INTERPULSE COAX TIP (DISPOSABLE) ×1
HOLDER FOLEY CATH W/STRAP (MISCELLANEOUS) ×2 IMPLANT
HOOD PEEL AWAY FLYTE STAYCOOL (MISCELLANEOUS) ×6 IMPLANT
KIT TURNOVER KIT A (KITS) ×2 IMPLANT
MANIFOLD NEPTUNE II (INSTRUMENTS) ×2 IMPLANT
NEEDLE HYPO 21X1.5 SAFETY (NEEDLE) ×2 IMPLANT
NS IRRIG 1000ML POUR BTL (IV SOLUTION) ×2 IMPLANT
PACK TOTAL KNEE CUSTOM (KITS) ×2 IMPLANT
PROTECTOR NERVE ULNAR (MISCELLANEOUS) ×2 IMPLANT
SET HNDPC FAN SPRY TIP SCT (DISPOSABLE) ×1 IMPLANT
STEM POLY PAT PLY 35M KNEE (Knees) ×2 IMPLANT
STEM TIBIA 5 DEG SZ F R KNEE (Knees) ×1 IMPLANT
STRIP CLOSURE SKIN 1/2X4 (GAUZE/BANDAGES/DRESSINGS) ×2 IMPLANT
SUT BONE WAX W31G (SUTURE) ×2 IMPLANT
SUT MNCRL AB 3-0 PS2 18 (SUTURE) ×2 IMPLANT
SUT STRATAFIX 0 PDS 27 VIOLET (SUTURE) ×2
SUT STRATAFIX PDS+ 0 24IN (SUTURE) ×2 IMPLANT
SUT VIC AB 1 CT1 36 (SUTURE) ×2 IMPLANT
SUTURE STRATFX 0 PDS 27 VIOLET (SUTURE) ×1 IMPLANT
SYR 30ML LL (SYRINGE) ×4 IMPLANT
TIBIA STEM 5 DEG SZ F R KNEE (Knees) ×2 IMPLANT
TRAY FOLEY MTR SLVR 16FR STAT (SET/KITS/TRAYS/PACK) ×2 IMPLANT
WATER STERILE IRR 1000ML POUR (IV SOLUTION) ×4 IMPLANT
WRAP KNEE MAXI GEL POST OP (GAUZE/BANDAGES/DRESSINGS) ×2 IMPLANT

## 2020-10-27 NOTE — Progress Notes (Signed)
Orthopedic Tech Progress Note Patient Details:  Gary Guzman 05/13/1955 [id number]  CPM Right Knee CPM Right Knee: On Right Knee Flexion (Degrees): 90 Right Knee Extension (Degrees): 0 Additional Comments: foot roll  Post Interventions Patient Tolerated: Well Instructions Provided: Care of device  [name] 10/27/2020, 9:15 AM

## 2020-10-27 NOTE — Anesthesia Postprocedure Evaluation (Signed)
Anesthesia Post Note  Patient: Gary Guzman  Procedure(s) Performed: TOTAL KNEE ARTHROPLASTY (Right: Knee)     Patient location during evaluation: Nursing Unit Anesthesia Type: Spinal Level of consciousness: oriented and awake and alert Pain management: pain level controlled Vital Signs Assessment: post-procedure vital signs reviewed and stable Respiratory status: spontaneous breathing and respiratory function stable Cardiovascular status: blood pressure returned to baseline and stable Postop Assessment: no headache, no backache, no apparent nausea or vomiting and patient able to bend at knees Anesthetic complications: no   No notable events documented.  Last Vitals:  Vitals:   10/27/20 1107 10/27/20 1322  BP: (!) 151/82 (!) 150/63  Pulse: 65 71  Resp: 16 18  Temp: (!) 36.4 C 36.5 C  SpO2: 97% 97%    Last Pain:  Vitals:   10/27/20 1322  TempSrc: Oral  PainSc:                  [place]

## 2020-10-27 NOTE — Evaluation (Signed)
Physical Therapy Evaluation Patient Details Name: Gary Guzman MRN: [id number] DOB: 09/10/1955 Today's Date: 10/27/2020  History of Present Illness  Patient is 65 y.o. male s/p Rt TKA on 10/27/20 with PMH significant for OA, CAD, CKD, HTN, depression, dizziness.    Clinical Impression  MICHELLE BOVA is a 65 y.o. male POD 0 s/p Rt TKA. Patient reports independence with mobility at baseline. Patient is now limited by functional impairments (see PT problem list below) and requires min guard/assist for transfers and gait with RW. Patient was able to ambulate ~65 feet with RW and min assist. Patient instructed in exercise to facilitate circulation to manage edema and reduce risk of DVT. Patient will benefit from continued skilled PT interventions to address impairments and progress towards PLOF. Acute PT will follow to progress mobility and stair training in preparation for safe discharge home.        Recommendations for follow up therapy are one component of a multi-disciplinary discharge planning process, led by the attending physician.  Recommendations may be updated based on patient status, additional functional criteria and insurance authorization.  Follow Up Recommendations Follow surgeon's recommendation for DC plan and follow-up therapies;Outpatient PT    Equipment Recommendations  None recommended by PT    Recommendations for Other Services       Precautions / Restrictions Precautions Precautions: Fall Restrictions Weight Bearing Restrictions: No Other Position/Activity Restrictions: WBAT      Mobility  Bed Mobility Overal bed mobility: Needs Assistance Bed Mobility: Supine to Sit     Supine to sit: Min guard;HOB elevated     General bed mobility comments: cues to bring LE's off EOB and to use bed rail to pivot. guarding for safety.    Transfers Overall transfer level: Needs assistance Equipment used: Rolling walker (2 wheeled) Transfers: Sit to/from Stand Sit to  Stand: Min guard;Min assist;From elevated surface         General transfer comment: EOB elevated, cues for hand placement and technique for power up. light assist to steady.  Ambulation/Gait Ambulation/Gait assistance: Min assist Gait Distance (Feet): 65 Feet Assistive device: Rolling walker (2 wheeled) Gait Pattern/deviations: Step-to pattern;Decreased stride length Gait velocity: decr   General Gait Details: cues for step pattern and proximity to RW, no overt LOB noted. Assist needed for walker position/management.  Stairs            Wheelchair Mobility    Modified Rankin (Stroke Patients Only)       Balance                                             Pertinent Vitals/Pain Pain Assessment: Faces Faces Pain Scale: Hurts a little bit Pain Location: Rt knee Pain Descriptors / Indicators: Aching;Discomfort Pain Intervention(s): Monitored during session;Limited activity within patient's tolerance    Home Living Family/patient expects to be discharged to:: Private residence Living Arrangements: Spouse/significant other Available Help at Discharge: Family Type of Home: House Home Access: Ramped entrance     Home Layout: One level Home Equipment: Environmental consultant - 2 wheels;Cane - single point;Bedside commode;Shower seat      Prior Function Level of Independence: Independent with assistive device(s)         Comments: occasionally used SPC     Hand Dominance   Dominant Hand: Left    Extremity/Trunk Assessment   Upper Extremity Assessment Upper Extremity Assessment: Overall  WFL for tasks assessed    Lower Extremity Assessment Lower Extremity Assessment: RLE deficits/detail RLE Deficits / Details: good quad activation, no extensor lag with SLR RLE Sensation: WNL RLE Coordination: WNL    Cervical / Trunk Assessment Cervical / Trunk Assessment: Normal  Communication   Communication: No difficulties  Cognition Arousal/Alertness:  Awake/alert Behavior During Therapy: WFL for tasks assessed/performed Overall Cognitive Status: Within Functional Limits for tasks assessed                                        General Comments      Exercises Total Joint Exercises Ankle Circles/Pumps: AROM;Both;20 reps;Seated   Assessment/Plan    PT Assessment Patient needs continued PT services  PT Problem List Decreased strength;Decreased range of motion;Decreased activity tolerance;Decreased balance;Decreased mobility;Decreased knowledge of use of DME;Decreased knowledge of precautions;Pain       PT Treatment Interventions DME instruction;Gait training;Stair training;Functional mobility training;Therapeutic activities;Therapeutic exercise;Balance training;Patient/family education    PT Goals (Current goals can be found in the Care Plan section)  Acute Rehab PT Goals Patient Stated Goal: get home and back to independence PT Goal Formulation: With patient Time For Goal Achievement: 11/03/20 Potential to Achieve Goals: Good    Frequency 7X/week   Barriers to discharge        Co-evaluation               AM-PAC PT "6 Clicks" Mobility  Outcome Measure Help needed turning from your back to your side while in a flat bed without using bedrails?: A Little Help needed moving from lying on your back to sitting on the side of a flat bed without using bedrails?: A Little Help needed moving to and from a bed to a chair (including a wheelchair)?: A Little Help needed standing up from a chair using your arms (e.g., wheelchair or bedside chair)?: A Little Help needed to walk in hospital room?: A Little Help needed climbing 3-5 steps with a railing? : A Lot 6 Click Score: 17    End of Session Equipment Utilized During Treatment: Gait belt Activity Tolerance: Patient tolerated treatment well Patient left: in chair;with call bell/phone within reach;with chair alarm set;with family/visitor present Nurse  Communication: Mobility status PT Visit Diagnosis: Muscle weakness (generalized) (M62.81);Difficulty in walking, not elsewhere classified (R26.2)    Time: [id number] PT Time Calculation (min) (ACUTE ONLY): 23 min   Charges:   PT Evaluation $PT Eval Low Complexity: 1 Low PT Treatments $Gait Training: 8-22 mins        Verner Mould, DPT Acute Rehabilitation Services Office [phone] Pager [phone]   Jacques Navy 10/27/2020, 1:51 PM

## 2020-10-27 NOTE — H&P (Signed)
MARKELLE NELLI MRN:  [id number] DOB/SEX:  [birth year]/male  CHIEF COMPLAINT:  Painful right Knee  HISTORY: Patient is a 65 y.o. male presented with a history of pain in the right knee. Onset of symptoms was gradual starting a few months ago with gradually worsening course since that time. Patient has been treated conservatively with over-the-counter NSAIDs and activity modification. Patient currently rates pain in the knee at 10 out of 10 with activity. There is pain at night.  PAST MEDICAL HISTORY: Patient Active Problem List   Diagnosis Date Noted   Passed out 06/10/2015   Peripheral neuropathy 06/10/2015   Chronic midline low back pain 11/04/2010   Past Medical History:  Diagnosis Date   Arthritis    Chronic kidney disease    Coronary artery disease    Depression    Dizziness    Dyspnea    Heart disease    High cholesterol    History of kidney stones    Hypertension    Kidney stones    Polyp of colon    Pre-diabetes    Sleep apnea    Past Surgical History:  Procedure Laterality Date   CARDIAC CATHETERIZATION     COLONOSCOPY WITH PROPOFOL N/A 05/09/2015   Procedure: COLONOSCOPY WITH PROPOFOL;  Surgeon: Rogene Houston, MD;  Location: [facility];  Service: Endoscopy;  Laterality: N/A;  10:30 - Ann to notify pt to arrive at [facility]  10/04/13   kidney stones       MEDICATIONS:   Medications Prior to Admission  Medication Sig Dispense Refill Last Dose   amLODipine (NORVASC) 5 MG tablet Take 5 mg by mouth every morning.   10/26/2020   carvedilol (COREG) 25 MG tablet Take 25 mg by mouth 2 (two) times daily.   10/27/2020 at 0400   DULoxetine (CYMBALTA) 30 MG capsule Take 30 mg by mouth at bedtime.   10/26/2020   finasteride (PROSCAR) 5 MG tablet Take 5 mg by mouth at bedtime.   10/26/2020   gabapentin (NEURONTIN) 800 MG tablet Take 800 mg by mouth See admin instructions. Take one tablet (800 mg) by mouth twice daily, may take a 3rd tablet (800 mg) during  the night as needed for foot pain   10/27/2020 at 0400   HYDROcodone-acetaminophen (NORCO) 10-325 MG tablet Take 1 tablet by mouth every 6 (six) hours as needed (pain).      lisinopril (ZESTRIL) 20 MG tablet Take 20 mg by mouth at bedtime as needed (SBP >150).   10/27/2020 at 0400   metFORMIN (GLUCOPHAGE) 500 MG tablet Take 500 mg by mouth every morning.   10/27/2020 at 0400   rosuvastatin (CRESTOR) 40 MG tablet Take 40 mg by mouth at bedtime.   10/26/2020   sildenafil (VIAGRA) 50 MG tablet Take 50 mg by mouth daily as needed for erectile dysfunction.   Past Week   tamsulosin (FLOMAX) 0.4 MG CAPS capsule Take 0.4 mg by mouth at bedtime.   10/26/2020   aspirin EC 81 MG tablet Take 81 mg by mouth every morning. Swallow whole.   10/21/2020   clopidogrel (PLAVIX) 75 MG tablet Take 75 mg by mouth every morning.   10/21/2020    ALLERGIES:   Allergies  Allergen Reactions   Buprenorphine Shortness Of Breath   Atorvastatin Other (See Comments)    Muscle aches   Codeine Nausea Only   Diclofenac Other (See Comments)    Elevates BP   Spironolactone Other (See Comments)  Near syncope    Hydrochlorothiazide Other (See Comments)    Dizziness, increased urination    REVIEW OF SYSTEMS:  A comprehensive review of systems was negative except for: Musculoskeletal: positive for arthralgias and bone pain   FAMILY HISTORY:   Family History  Problem Relation Age of Onset   Emphysema Father    Alzheimer's disease Mother    Hypertension Mother    Hyperlipidemia Mother     SOCIAL HISTORY:   Social History   Tobacco Use   Smoking status: Former   Smokeless tobacco: Never   Tobacco comments:    Quit 12 years ago.  Substance Use Topics   Alcohol use: No    Alcohol/week: 0.0 standard drinks    Comment: quit 12 yrs ago     EXAMINATION:  Vital signs in last 24 hours: Temp:  [98.4 F (36.9 C)] 98.4 F (36.9 C) (09/19 0602) Pulse Rate:  [71] 71 (09/19 0602) Resp:  [18] 18 (09/19 0602) BP:  (177)/(71) 177/71 (09/19 0602) SpO2:  [99 %] 99 % (09/19 0602)  BP (!) 177/71   Pulse 71   Temp 98.4 F (36.9 C) (Oral)   Resp 18   SpO2 99%   General Appearance:    Alert, cooperative, no distress, appears stated age  Head:    Normocephalic, without obvious abnormality, atraumatic  Eyes:    PERRL, conjunctiva/corneas clear, EOM's intact, fundi    benign, both eyes       Ears:    Normal TM's and external ear canals, both ears  Nose:   Nares normal, septum midline, mucosa normal, no drainage    or sinus tenderness  Throat:   Lips, mucosa, and tongue normal; teeth and gums normal  Neck:   Supple, symmetrical, trachea midline, no adenopathy;       thyroid:  No enlargement/tenderness/nodules; no carotid   bruit or JVD  Back:     Symmetric, no curvature, ROM normal, no CVA tenderness  Lungs:     Clear to auscultation bilaterally, respirations unlabored  Chest wall:    No tenderness or deformity  Heart:    Regular rate and rhythm, S1 and S2 normal, no murmur, rub   or gallop  Abdomen:     Soft, non-tender, bowel sounds active all four quadrants,    no masses, no organomegaly  Genitalia:    Normal male without lesion, discharge or tenderness  Rectal:    Normal tone, normal prostate, no masses or tenderness;   guaiac negative stool  Extremities:   Extremities normal, atraumatic, no cyanosis or edema  Pulses:   2+ and symmetric all extremities  Skin:   Skin color, texture, turgor normal, no rashes or lesions  Lymph nodes:   Cervical, supraclavicular, and axillary nodes normal  Neurologic:   CNII-XII intact. Normal strength, sensation and reflexes      throughout    Musculoskeletal:  ROM 0-120, Ligaments intact,  Imaging Review Plain radiographs demonstrate severe degenerative joint disease of the right knee. The overall alignment is neutral. The bone quality appears to be good for age and reported activity level.  Assessment/Plan: Primary osteoarthritis, right knee   The patient  history, physical examination and imaging studies are consistent with advanced degenerative joint disease of the right knee. The patient has failed conservative treatment.  The clearance notes were reviewed.  After discussion with the patient it was felt that Total Knee Replacement was indicated. The procedure,  risks, and benefits of total knee arthroplasty were presented and  reviewed. The risks including but not limited to aseptic loosening, infection, blood clots, vascular injury, stiffness, patella tracking problems complications among others were discussed. The patient acknowledged the explanation, agreed to proceed with the plan.  Preoperative templating of the joint replacement has been completed, documented, and submitted to the Operating Room personnel in order to optimize intra-operative equipment management.    Patient's anticipated LOS is less than 2 midnights, meeting these requirements:  - Lives within 1 hour of care - Has a competent adult at home to recover with post-op recover - NO history of  - Chronic pain requiring opiods  - Diabetes  - Coronary Artery Disease  - Heart failure  - Heart attack  - Stroke  - DVT/VTE  - Cardiac arrhythmia  - Respiratory Failure/COPD  - Renal failure  - Anemia  - Advanced Liver disease     Donia Ast 10/27/2020, 7:03 AM

## 2020-10-27 NOTE — Anesthesia Procedure Notes (Signed)
Anesthesia Regional Block: Adductor canal block   Pre-Anesthetic Checklist: , timeout performed,  Correct Patient, Correct Site, Correct Laterality,  Correct Procedure, Correct Position, site marked,  Risks and benefits discussed,  Surgical consent,  Pre-op evaluation,  At surgeon's request and post-op pain management  Laterality: Lower and Right  Prep: chloraprep       Needles:  Injection technique: Single-shot  Needle Type: Echogenic Needle     Needle Length: 9cm  Needle Gauge: 22     Additional Needles:   Procedures:,,,, ultrasound used (permanent image in chart),,    Narrative:  Start time: 10/27/2020 6:52 AM End time: 10/27/2020 6:57 AM Injection made incrementally with aspirations every 5 mL.  Performed by: Personally  Anesthesiologist: Barnet Glasgow, MD  Additional Notes: Block assessed prior to surgery. Pt tolerated procedure well.

## 2020-10-27 NOTE — Anesthesia Procedure Notes (Signed)
Spinal  Patient location during procedure: OR Start time: 10/27/2020 7:22 AM End time: 10/27/2020 7:28 AM Reason for block: surgical anesthesia Staffing Anesthesiologist: Barnet Glasgow, MD Preanesthetic Checklist Completed: patient identified, IV checked, site marked, risks and benefits discussed, surgical consent, monitors and equipment checked, pre-op evaluation and timeout performed Spinal Block Patient position: sitting Prep: DuraPrep Patient monitoring: heart rate, cardiac monitor, continuous pulse ox and blood pressure Approach: midline Location: L3-4 Injection technique: single-shot Needle Needle type: Sprotte  Needle gauge: 24 G Needle length: 9 cm Needle insertion depth: 7 cm Assessment Sensory level: T4 Events: CSF return Additional Notes 2 Atempts at L3- L4

## 2020-10-27 NOTE — Anesthesia Procedure Notes (Signed)
Procedure Name: MAC Date/Time: 10/27/2020 7:22 AM Performed by: West Pugh, CRNA Pre-anesthesia Checklist: Patient identified, Emergency Drugs available, Suction available, Patient being monitored and Timeout performed Patient Re-evaluated:Patient Re-evaluated prior to induction Oxygen Delivery Method: Simple face mask Preoxygenation: Pre-oxygenation with 100% oxygen Induction Type: IV induction Placement Confirmation: positive ETCO2 Dental Injury: Teeth and Oropharynx as per pre-operative assessment

## 2020-10-27 NOTE — Progress Notes (Signed)
Physical Therapy Treatment Patient Details Name: Gary Guzman MRN: [id number] DOB: 1955/03/23 Today's Date: 10/27/2020   History of Present Illness Patient is 65 y.o. male s/p Rt TKA on 10/27/20 with PMH significant for OA, CAD, CKD, HTN, depression, dizziness.    PT Comments    Patient seen for additional session with the goal of discharging home tonight. Pt ambulated additional 50' with min guard/supervision and good recall for safe step pattern. Educated on HEP for circulation and ROM and all questions addressed. Patient is at safe level for discharge home with assist from family. Acute PT will continue to progress pt if he remains in acute setting.      Recommendations for follow up therapy are one component of a multi-disciplinary discharge planning process, led by the attending physician.  Recommendations may be updated based on patient status, additional functional criteria and insurance authorization.  Follow Up Recommendations  Follow surgeon's recommendation for DC plan and follow-up therapies;Outpatient PT     Equipment Recommendations  None recommended by PT    Recommendations for Other Services       Precautions / Restrictions Precautions Precautions: Fall Restrictions Weight Bearing Restrictions: No Other Position/Activity Restrictions: WBAT     Mobility  Bed Mobility               General bed mobility comments: pt OOB in recliner    Transfers Overall transfer level: Needs assistance Equipment used: Rolling walker (2 wheeled) Transfers: Sit to/from Stand Sit to Stand: Min guard;Supervision         General transfer comment: pt with good recall for safe hand placement on recliner, no assist needed to rise. cues for reach back and extend Rt knee to prevent too much flexion with sitting.  Ambulation/Gait Ambulation/Gait assistance: Supervision;Min guard Gait Distance (Feet): 50 Feet Assistive device: Rolling walker (2 wheeled) Gait  Pattern/deviations: Step-to pattern;Decreased stride length Gait velocity: decr   General Gait Details: pt with good recall for safe step pattern and no overt LOB. pt maintained safe proximity to RW throughout.   Stairs             Wheelchair Mobility    Modified Rankin (Stroke Patients Only)       Balance Overall balance assessment: Needs assistance Sitting-balance support: Feet supported;Bilateral upper extremity supported Sitting balance-Leahy Scale: Good     Standing balance support: During functional activity;Bilateral upper extremity supported Standing balance-Leahy Scale: Fair                              Cognition Arousal/Alertness: Awake/alert Behavior During Therapy: WFL for tasks assessed/performed Overall Cognitive Status: Within Functional Limits for tasks assessed                                        Exercises Total Joint Exercises Ankle Circles/Pumps: AROM;Both;20 reps;Seated Quad Sets: AROM;5 reps;Seated;Right Short Arc Quad: AROM;5 reps;Seated;Right Heel Slides: AROM;5 reps;Seated;Right Hip ABduction/ADduction: AROM;5 reps;Seated;Right Straight Leg Raises: AROM;5 reps;Seated;Right    General Comments        Pertinent Vitals/Pain Pain Assessment: 0-10 Faces Pain Scale: Hurts a little bit Pain Location: Rt knee Pain Descriptors / Indicators: Aching;Discomfort Pain Intervention(s): Limited activity within patient's tolerance;Monitored during session;Repositioned;Ice applied    Home Living Family/patient expects to be discharged to:: Private residence Living Arrangements: Spouse/significant other Available Help at Discharge: Family Type of  Home: House Home Access: [place]: One level Home Equipment: Prichard - 2 wheels;Cane - single point;Bedside commode;Shower seat      Prior Function Level of Independence: Independent with assistive device(s)      Comments: occasionally used SPC    PT Goals (current goals can now be found in the care plan section) Acute Rehab PT Goals Patient Stated Goal: get home and back to independence PT Goal Formulation: With patient Time For Goal Achievement: 11/03/20 Potential to Achieve Goals: Good Progress towards PT goals: Progressing toward goals    Frequency    7X/week      PT Plan Current plan remains appropriate    Co-evaluation              AM-PAC PT "6 Clicks" Mobility   Outcome Measure  Help needed turning from your back to your side while in a flat bed without using bedrails?: A Little Help needed moving from lying on your back to sitting on the side of a flat bed without using bedrails?: A Little Help needed moving to and from a bed to a chair (including a wheelchair)?: A Little Help needed standing up from a chair using your arms (e.g., wheelchair or bedside chair)?: A Little Help needed to walk in hospital room?: A Little Help needed climbing 3-5 steps with a railing? : A Lot 6 Click Score: 17    End of Session Equipment Utilized During Treatment: Gait belt Activity Tolerance: Patient tolerated treatment well Patient left: in chair;with call bell/phone within reach;with chair alarm set;with family/visitor present Nurse Communication: Mobility status PT Visit Diagnosis: Muscle weakness (generalized) (M62.81);Difficulty in walking, not elsewhere classified (R26.2)     Time: [id number] PT Time Calculation (min) (ACUTE ONLY): 29 min  Charges:  $Gait Training: 8-22 mins $Therapeutic Exercise: 8-22 mins                     Gary Guzman, DPT Acute Rehabilitation Services Office [phone] Pager [phone]    Gary Guzman 10/27/2020, 5:58 PM

## 2020-10-27 NOTE — Transfer of Care (Signed)
Immediate Anesthesia Transfer of Care Note  Patient: Gary Guzman  Procedure(s) Performed: TOTAL KNEE ARTHROPLASTY (Right: Knee)  Patient Location: PACU  Anesthesia Type:Spinal and MAC combined with regional for post-op pain  Level of Consciousness: drowsy and patient cooperative  Airway & Oxygen Therapy: Patient Spontanous Breathing and Patient connected to face mask oxygen  Post-op Assessment: Report given to RN and Post -op Vital signs reviewed and stable  Post vital signs: Reviewed and stable  Last Vitals:  Vitals Value Taken Time  BP 115/54 10/27/20 0903  Temp    Pulse 58 10/27/20 0908  Resp 11 10/27/20 0908  SpO2 100 % 10/27/20 0908  Vitals shown include unvalidated device data.  Last Pain:  Vitals:   10/27/20 0602  TempSrc: Oral         Complications: No notable events documented.

## 2020-10-27 NOTE — Op Note (Signed)
TOTAL KNEE REPLACEMENT OPERATIVE NOTE:  10/27/2020  9:10 AM  PATIENT:  Gary Guzman  65 y.o. male  PRE-OPERATIVE DIAGNOSIS:  Osteoarthritis of right knee M17.11  POST-OPERATIVE DIAGNOSIS:  Osteoarthritis of right knee M17.11  PROCEDURE:  Procedure(s): TOTAL KNEE ARTHROPLASTY  SURGEON:  Surgeon(s): Vickey Huger, MD  PHYSICIAN ASSISTANT: Carlyon Shadow, PA-C   ANESTHESIA:   spinal  SPECIMEN: None  COUNTS:  Correct  TOURNIQUET:   Total Tourniquet Time Documented: Thigh (Right) - 45 minutes Total: Thigh (Right) - 45 minutes   DICTATION:  Indication for procedure:    The patient is a 65 y.o. male who has failed conservative treatment for Osteoarthritis of right knee M17.11.  Informed consent was obtained prior to anesthesia. The risks versus benefits of the operation were explain and in a way the patient can, and did, understand.    Description of procedure:     The patient was taken to the operating room and placed under anesthesia.  The patient was positioned in the usual fashion taking care that all body parts were adequately padded and/or protected.  A tourniquet was applied and the leg prepped and draped in the usual sterile fashion.  The extremity was exsanguinated with the esmarch and tourniquet inflated to 250 mmHg.  Pre-operative range of motion was normal.  A midline incision approximately 6-7 inches long was made with a #10 blade.  A new blade was used to make a parapatellar arthrotomy going 2-3 cm into the quadriceps tendon, over the patella, and alongside the medial aspect of the patellar tendon.  A synovectomy was then performed with the #10 blade and forceps. I then elevated the deep MCL off the medial tibial metaphysis subperiosteally around to the semimembranosus attachment.    I everted the patella and used calipers to measure patellar thickness.  I used the reamer to ream down to appropriate thickness to recreate the native thickness.  I then removed excess  bone with the rongeur and sagittal saw.  I used the appropriately sized template and drilled the three lug holes.  I then put the trial in place and measured the thickness with the calipers to ensure recreation of the native thickness.  The trial was then removed and the patella subluxed and the knee brought into flexion.  A homan retractor was place to retract and protect the patella and lateral structures.  A Z-retractor was place medially to protect the medial structures.  The extra-medullary alignment system was used to make cut the tibial articular surface perpendicular to the anamotic axis of the tibia and in 3 degrees of posterior slope.  The cut surface and alignment jig was removed.  I then used the intramedullary alignment guide to make a valgus cut on the distal femur.  I then marked out the epicondylar axis on the distal femur.   I then used the anterior referencing sizer and measured the femur to be a size 10.  The 4-In-1 cutting block was screwed into place in external rotation matching the posterior condylar angle, making our cuts perpendicular to the epicondylar axis.  Anterior, posterior and chamfer cuts were made with the sagittal saw.  The cutting block and cut pieces were removed.  A lamina spreader was placed in 90 degrees of flexion.  The ACL, PCL, menisci, and posterior condylar osteophytes were removed.  A 10 mm spacer blocked was found to offer good flexion and extension gap balance after minimal in degree releasing.   The scoop retractor was then placed and  the femoral finishing block was pinned in place.  The small sagittal saw was used as well as the lug drill to finish the femur.  The block and cut surfaces were removed and the medullary canal hole filled with autograft bone from the cut pieces.  The tibia was delivered forward in deep flexion and external rotation.  A size F tray was selected and pinned into place centered on the medial 1/3 of the tibial tubercle.  The reamer  and keel was used to prepare the tibia through the tray.    I then trialed with the size 10 femur, size F tibia, a 10 mm insert and the 35 patella.  I had excellent flexion/extension gap balance, excellent patella tracking.  Flexion was full and beyond 120 degrees; extension was zero.  These components were chosen and the staff opened them to me on the back table while the knee was lavaged copiously and the cement mixed.  The soft tissue was infiltrated with 60cc of exparel 1.3% through a 21 gauge needle.  I cemented in the components and removed all excess cement.  The polyethylene tibial component was snapped into place and the knee placed in extension while cement was hardening.  The capsule was infilltrated with a 60cc exparel/marcaine/saline mixture.   Once the cement was hard, the tourniquet was let down.  Hemostasis was obtained.  The arthrotomy was closed using a #1 stratofix running suture.  The deep soft tissues were closed with #0 vicryls and the subcuticular layer closed with #2-0 vicryl.  The skin was reapproximated and closed with 3.0 Monocryl.  The wound was covered with steristrips, aquacel dressing, and a TED stocking.   The patient was then awakened, extubated, and taken to the recovery room in stable condition.  BLOOD LOSS:  284XL COMPLICATIONS:  None.  PLAN OF CARE: Admit for overnight observation  PATIENT DISPOSITION:  PACU - hemodynamically stable.   Delay start of Pharmacological VTE agent (>24hrs) due to surgical blood loss or risk of bleeding:  yes  Please fax a copy of this op note to my office at [phone] (please only include page 1 and 2 of the Case Information op note)

## 2020-10-28 ENCOUNTER — Encounter (HOSPITAL_COMMUNITY): Payer: Self-pay | Admitting: Orthopedic Surgery

## 2020-11-03 NOTE — Discharge Summary (Signed)
[place]   Lara Mulch, MD   Carlyon Shadow, PA-C [place], [place], [place]  [id number]                             [phone]  PATIENT ID: Gary Guzman        MRN:  [id number]          DOB/AGE: 1955/11/21 / 65 y.o.    DISCHARGE SUMMARY  ADMISSION DATE:    10/27/2020 DISCHARGE DATE:   10/27/2020  ADMISSION DIAGNOSIS: S/P total knee replacement [Z96.659]    DISCHARGE DIAGNOSIS:  Osteoarthritis of right knee M17.11    ADDITIONAL DIAGNOSIS: Active Problems:   S/P total knee replacement  Past Medical History:  Diagnosis Date   Arthritis    Chronic kidney disease    Coronary artery disease    Depression    Dizziness    Dyspnea    Heart disease    High cholesterol    History of kidney stones    Hypertension    Kidney stones    Polyp of colon    Pre-diabetes    Sleep apnea     PROCEDURE: Procedure(s): TOTAL KNEE ARTHROPLASTY on 10/27/2020  CONSULTS:    HISTORY:  See H&P in chart  HOSPITAL COURSE:  ERYCK NEGRON is a 66 y.o. admitted on 10/27/2020 and found to have a diagnosis of Osteoarthritis of right knee M17.11.  After appropriate laboratory studies were obtained  they were taken to the operating room on 10/27/2020 and underwent Procedure(s): TOTAL KNEE ARTHROPLASTY.   They were given perioperative antibiotics:  Anti-infectives (From admission, onward)    Start     Dose/Rate Route Frequency Ordered Stop   10/27/20 1400  ceFAZolin (ANCEF) IVPB 2g/100 mL premix  Status:  Discontinued        2 g 200 mL/hr over 30 Minutes Intravenous Every 6 hours 10/27/20 1058 10/27/20 2316   10/27/20 0600  ceFAZolin (ANCEF) IVPB 2g/100 mL premix        2 g 200 mL/hr over 30 Minutes Intravenous On call to O.R. 10/27/20 6378 10/27/20 0759     .  Patient given tranexamic acid IV or topical and exparel intra-operatively.  Tolerated the procedure well.    POD# 1: Vital signs were stable.  Patient denied Chest pain, shortness of breath, or  calf pain.  Patient was started on Aspirin twice daily at 8am.  Consults to PT, OT, and care management were made.  The patient was weight bearing as tolerated.  CPM was placed on the operative leg 0-90 degrees for 6-8 hours a day. When out of the CPM, patient was placed in the foam block to achieve full extension. Incentive spirometry was taught.  Dressing was changed.       POD #2, Continued  PT for ambulation and exercise program.  IV saline locked.  O2 discontinued.    The remainder of the hospital course was dedicated to ambulation and strengthening.   The patient was discharged in Good condition.  Blood products given:none  DIAGNOSTIC STUDIES: Recent vital signs: No data found.     Recent laboratory studies: No results for input(s): WBC, HGB, HCT, PLT in the last 168 hours. No results for input(s): NA, K, CL, CO2, BUN, CREATININE, GLUCOSE, CALCIUM in the last 168 hours. No results found for: INR, PROTIME   Recent Radiographic Studies :  No results found.  DISCHARGE INSTRUCTIONS: Discharge  Instructions     Call MD / Call 911   Complete by: As directed    If you experience chest pain or shortness of breath, CALL 911 and be transported to the hospital emergency room.  If you develope a fever above 101 F, pus (white drainage) or increased drainage or redness at the wound, or calf pain, call your surgeon's office.   Call MD / Call 911   Complete by: As directed    If you experience chest pain or shortness of breath, CALL 911 and be transported to the hospital emergency room.  If you develope a fever above 101 F, pus (white drainage) or increased drainage or redness at the wound, or calf pain, call your surgeon's office.   Constipation Prevention   Complete by: As directed    Drink plenty of fluids.  Prune juice may be helpful.  You may use a stool softener, such as Colace (over the counter) 100 mg twice a day.  Use MiraLax (over the counter) for constipation as needed.    Constipation Prevention   Complete by: As directed    Drink plenty of fluids.  Prune juice may be helpful.  You may use a stool softener, such as Colace (over the counter) 100 mg twice a day.  Use MiraLax (over the counter) for constipation as needed.   Diet - low sodium heart healthy   Complete by: As directed    Diet - low sodium heart healthy   Complete by: As directed    Discharge instructions   Complete by: As directed    INSTRUCTIONS AFTER JOINT REPLACEMENT   Remove items at home which could result in a fall. This includes throw rugs or furniture in walking pathways ICE to the affected joint every three hours while awake for 30 minutes at a time, for at least the first 3-5 days, and then as needed for pain and swelling.  Continue to use ice for pain and swelling. You may notice swelling that will progress down to the foot and ankle.  This is normal after surgery.  Elevate your leg when you are not up walking on it.   Continue to use the breathing machine you got in the hospital (incentive spirometer) which will help keep your temperature down.  It is common for your temperature to cycle up and down following surgery, especially at night when you are not up moving around and exerting yourself.  The breathing machine keeps your lungs expanded and your temperature down.   DIET:  As you were doing prior to hospitalization, we recommend a well-balanced diet.  DRESSING / WOUND CARE / SHOWERING  Keep the surgical dressing until follow up.  The dressing is water proof, so you can shower without any extra covering.  IF THE DRESSING FALLS OFF or the wound gets wet inside, change the dressing with sterile gauze.  Please use good hand washing techniques before changing the dressing.  Do not use any lotions or creams on the incision until instructed by your surgeon.    ACTIVITY  Increase activity slowly as tolerated, but follow the weight bearing instructions below.   No driving for 6 weeks or  until further direction given by your physician.  You cannot drive while taking narcotics.  No lifting or carrying greater than 10 lbs. until further directed by your surgeon. Avoid periods of inactivity such as sitting longer than an hour when not asleep. This helps prevent blood clots.  You may return to work  once you are authorized by your doctor.     WEIGHT BEARING   Weight bearing as tolerated with assist device (walker, cane, etc) as directed, use it as long as suggested by your surgeon or therapist, typically at least 4-6 weeks.   EXERCISES  Results after joint replacement surgery are often greatly improved when you follow the exercise, range of motion and muscle strengthening exercises prescribed by your doctor. Safety measures are also important to protect the joint from further injury. Any time any of these exercises cause you to have increased pain or swelling, decrease what you are doing until you are comfortable again and then slowly increase them. If you have problems or questions, call your caregiver or physical therapist for advice.   Rehabilitation is important following a joint replacement. After just a few days of immobilization, the muscles of the leg can become weakened and shrink (atrophy).  These exercises are designed to build up the tone and strength of the thigh and leg muscles and to improve motion. Often times heat used for twenty to thirty minutes before working out will loosen up your tissues and help with improving the range of motion but do not use heat for the first two weeks following surgery (sometimes heat can increase post-operative swelling).   These exercises can be done on a training (exercise) mat, on the floor, on a table or on a bed. Use whatever works the best and is most comfortable for you.    Use music or television while you are exercising so that the exercises are a pleasant break in your day. This will make your life better with the exercises acting  as a break in your routine that you can look forward to.   Perform all exercises about fifteen times, three times per day or as directed.  You should exercise both the operative leg and the other leg as well.  Exercises include:   Quad Sets - Tighten up the muscle on the front of the thigh (Quad) and hold for 5-10 seconds.   Straight Leg Raises - With your knee straight (if you were given a brace, keep it on), lift the leg to 60 degrees, hold for 3 seconds, and slowly lower the leg.  Perform this exercise against resistance later as your leg gets stronger.  Leg Slides: Lying on your back, slowly slide your foot toward your buttocks, bending your knee up off the floor (only go as far as is comfortable). Then slowly slide your foot back down until your leg is flat on the floor again.  Angel Wings: Lying on your back spread your legs to the side as far apart as you can without causing discomfort.  Hamstring Strength:  Lying on your back, push your heel against the floor with your leg straight by tightening up the muscles of your buttocks.  Repeat, but this time bend your knee to a comfortable angle, and push your heel against the floor.  You may put a pillow under the heel to make it more comfortable if necessary.   A rehabilitation program following joint replacement surgery can speed recovery and prevent re-injury in the future due to weakened muscles. Contact your doctor or a physical therapist for more information on knee rehabilitation.    CONSTIPATION  Constipation is defined medically as fewer than three stools per week and severe constipation as less than one stool per week.  Even if you have a regular bowel pattern at home, your normal regimen is likely  to be disrupted due to multiple reasons following surgery.  Combination of anesthesia, postoperative narcotics, change in appetite and fluid intake all can affect your bowels.   YOU MUST use at least one of the following options; they are  listed in order of increasing strength to get the job done.  They are all available over the counter, and you may need to use some, POSSIBLY even all of these options:    Drink plenty of fluids (prune juice may be helpful) and high fiber foods Colace 100 mg by mouth twice a day  Senokot for constipation as directed and as needed Dulcolax (bisacodyl), take with full glass of water  Miralax (polyethylene glycol) once or twice a day as needed.  If you have tried all these things and are unable to have a bowel movement in the first 3-4 days after surgery call either your surgeon or your primary doctor.    If you experience loose stools or diarrhea, hold the medications until you stool forms back up.  If your symptoms do not get better within 1 week or if they get worse, check with your doctor.  If you experience "the worst abdominal pain ever" or develop nausea or vomiting, please contact the office immediately for further recommendations for treatment.   ITCHING:  If you experience itching with your medications, try taking only a single pain pill, or even half a pain pill at a time.  You can also use Benadryl over the counter for itching or also to help with sleep.   TED HOSE STOCKINGS:  Use stockings on both legs until for at least 2 weeks or as directed by physician office. They may be removed at night for sleeping.  MEDICATIONS:  See your medication summary on the "After Visit Summary" that nursing will review with you.  You may have some home medications which will be placed on hold until you complete the course of blood thinner medication.  It is important for you to complete the blood thinner medication as prescribed.  PRECAUTIONS:  If you experience chest pain or shortness of breath - call 911 immediately for transfer to the hospital emergency department.   If you develop a fever greater that 101 F, purulent drainage from wound, increased redness or drainage from wound, foul odor from the  wound/dressing, or calf pain - CONTACT YOUR SURGEON.                                                   FOLLOW-UP APPOINTMENTS:  If you do not already have a post-op appointment, please call the office for an appointment to be seen by your surgeon.  Guidelines for how soon to be seen are listed in your "After Visit Summary", but are typically between 1-4 weeks after surgery.  OTHER INSTRUCTIONS:   Knee Replacement:  Do not place pillow under knee, focus on keeping the knee straight while resting. CPM instructions: 0-90 degrees, 2 hours in the morning, 2 hours in the afternoon, and 2 hours in the evening. Place foam block, curve side up under heel at all times except when in CPM or when walking.  DO NOT modify, tear, cut, or change the foam block in any way.  POST-OPERATIVE OPIOID TAPER INSTRUCTIONS: It is important to wean off of your opioid medication as soon as possible. If you do not  need pain medication after your surgery it is ok to stop day one. Opioids include: Codeine, Hydrocodone(Norco, Vicodin), Oxycodone(Percocet, oxycontin) and hydromorphone amongst others.  Long term and even short term use of opiods can cause: Increased pain response Dependence Constipation Depression Respiratory depression And more.  Withdrawal symptoms can include Flu like symptoms Nausea, vomiting And more Techniques to manage these symptoms Hydrate well Eat regular healthy meals Stay active Use relaxation techniques(deep breathing, meditating, yoga) Do Not substitute Alcohol to help with tapering If you have been on opioids for less than two weeks and do not have pain than it is ok to stop all together.  Plan to wean off of opioids This plan should start within one week post op of your joint replacement. Maintain the same interval or time between taking each dose and first decrease the dose.  Cut the total daily intake of opioids by one tablet each day Next start to increase the time between  doses. The last dose that should be eliminated is the evening dose.     MAKE SURE YOU:  Understand these instructions.  Get help right away if you are not doing well or get worse.    Thank you for letting us be a part of your medical care team.  It is a privilege we respect greatly.  We hope these instructions will help you stay on track for a fast and full recovery!   Increase activity slowly as tolerated   Complete by: As directed    Increase activity slowly as tolerated   Complete by: As directed    Post-operative opioid taper instructions:   Complete by: As directed    POST-OPERATIVE OPIOID TAPER INSTRUCTIONS: It is important to wean off of your opioid medication as soon as possible. If you do not need pain medication after your surgery it is ok to stop day one. Opioids include: Codeine, Hydrocodone(Norco, Vicodin), Oxycodone(Percocet, oxycontin) and hydromorphone amongst others.  Long term and even short term use of opiods can cause: Increased pain response Dependence Constipation Depression Respiratory depression And more.  Withdrawal symptoms can include Flu like symptoms Nausea, vomiting And more Techniques to manage these symptoms Hydrate well Eat regular healthy meals Stay active Use relaxation techniques(deep breathing, meditating, yoga) Do Not substitute Alcohol to help with tapering If you have been on opioids for less than two weeks and do not have pain than it is ok to stop all together.  Plan to wean off of opioids This plan should start within one week post op of your joint replacement. Maintain the same interval or time between taking each dose and first decrease the dose.  Cut the total daily intake of opioids by one tablet each day Next start to increase the time between doses. The last dose that should be eliminated is the evening dose.      Post-operative opioid taper instructions:   Complete by: As directed    POST-OPERATIVE OPIOID TAPER  INSTRUCTIONS: It is important to wean off of your opioid medication as soon as possible. If you do not need pain medication after your surgery it is ok to stop day one. Opioids include: Codeine, Hydrocodone(Norco, Vicodin), Oxycodone(Percocet, oxycontin) and hydromorphone amongst others.  Long term and even short term use of opiods can cause: Increased pain response Dependence Constipation Depression Respiratory depression And more.  Withdrawal symptoms can include Flu like symptoms Nausea, vomiting And more Techniques to manage these symptoms Hydrate well Eat regular healthy meals Stay active Use relaxation techniques(deep breathing,  meditating, yoga) Do Not substitute Alcohol to help with tapering If you have been on opioids for less than two weeks and do not have pain than it is ok to stop all together.  Plan to wean off of opioids This plan should start within one week post op of your joint replacement. Maintain the same interval or time between taking each dose and first decrease the dose.  Cut the total daily intake of opioids by one tablet each day Next start to increase the time between doses. The last dose that should be eliminated is the evening dose.          DISCHARGE MEDICATIONS:   Allergies as of 10/27/2020       Reactions   Buprenorphine Shortness Of Breath   Atorvastatin Other (See Comments)   Muscle aches   Codeine Nausea Only   Diclofenac Other (See Comments)   Elevates BP   Spironolactone Other (See Comments)   Near syncope   Hydrochlorothiazide Other (See Comments)   Dizziness, increased urination        Medication List     TAKE these medications    amLODipine 5 MG tablet Commonly known as: NORVASC Take 5 mg by mouth every morning.   aspirin EC 81 MG tablet Take 81 mg by mouth every morning. Swallow whole.   carvedilol 25 MG tablet Commonly known as: COREG Take 25 mg by mouth 2 (two) times daily.   clopidogrel 75 MG  tablet Commonly known as: PLAVIX Take 75 mg by mouth every morning.   DULoxetine 30 MG capsule Commonly known as: CYMBALTA Take 30 mg by mouth at bedtime.   finasteride 5 MG tablet Commonly known as: PROSCAR Take 5 mg by mouth at bedtime.   gabapentin 800 MG tablet Commonly known as: NEURONTIN Take 800 mg by mouth See admin instructions. Take one tablet (800 mg) by mouth twice daily, may take a 3rd tablet (800 mg) during the night as needed for foot pain   HYDROcodone-acetaminophen 10-325 MG tablet Commonly known as: NORCO Take 1 tablet by mouth every 6 (six) hours as needed (pain).   HYDROmorphone 2 MG tablet Commonly known as: Dilaudid Take 1 tablet (2 mg total) by mouth every 4 (four) hours as needed for up to 7 days for severe pain. Max 6 pills in 24 hrs   lisinopril 20 MG tablet Commonly known as: ZESTRIL Take 20 mg by mouth at bedtime as needed (SBP >150).   metFORMIN 500 MG tablet Commonly known as: GLUCOPHAGE Take 500 mg by mouth every morning.   methocarbamol 500 MG tablet Commonly known as: ROBAXIN Take 1-2 tablets (500-1,000 mg total) by mouth every 6 (six) hours as needed for muscle spasms.   rosuvastatin 40 MG tablet Commonly known as: CRESTOR Take 40 mg by mouth at bedtime.   sildenafil 50 MG tablet Commonly known as: VIAGRA Take 50 mg by mouth daily as needed for erectile dysfunction.   tamsulosin 0.4 MG Caps capsule Commonly known as: FLOMAX Take 0.4 mg by mouth at bedtime.        FOLLOW UP VISIT:    DISPOSITION: HOME VS. SNF  Dental Antibiotics:  In most cases prophylactic antibiotics for Dental procdeures after total joint surgery are not necessary.  Exceptions are as follows:  1. History of prior total joint infection  2. Severely immunocompromised (Organ Transplant, cancer chemotherapy, Rheumatoid biologic meds such as Gallant)  3. Poorly controlled diabetes (A1C &gt; 8.0, blood glucose over 200)  If you have  one of these  conditions, contact your surgeon for an antibiotic prescription, prior to your dental procedure.   CONDITION:  Good   Donia Ast 11/03/2020, 8:46 PM

## 2020-11-26 ENCOUNTER — Other Ambulatory Visit: Payer: Self-pay | Admitting: Family Medicine

## 2020-11-26 ENCOUNTER — Other Ambulatory Visit (HOSPITAL_COMMUNITY): Payer: Self-pay | Admitting: Family Medicine

## 2020-11-26 DIAGNOSIS — Z87891 Personal history of nicotine dependence: Secondary | ICD-10-CM

## 2020-12-25 ENCOUNTER — Ambulatory Visit (INDEPENDENT_AMBULATORY_CARE_PROVIDER_SITE_OTHER): Payer: Medicare HMO | Admitting: Gastroenterology

## 2021-01-13 ENCOUNTER — Encounter (INDEPENDENT_AMBULATORY_CARE_PROVIDER_SITE_OTHER): Payer: Self-pay | Admitting: *Deleted

## 2021-01-13 ENCOUNTER — Ambulatory Visit (INDEPENDENT_AMBULATORY_CARE_PROVIDER_SITE_OTHER): Payer: Medicare HMO | Admitting: Gastroenterology

## 2022-12-06 ENCOUNTER — Encounter (HOSPITAL_COMMUNITY): Payer: Self-pay
